# Patient Record
Sex: Female | Born: 1935 | Hispanic: Yes | Smoking: Never smoker
Health system: Southern US, Community
[De-identification: ages and names within clinical notes are randomized; demographics above are authoritative.]

## PROBLEM LIST (undated history)

## (undated) DIAGNOSIS — I252 Old myocardial infarction: Secondary | ICD-10-CM

## (undated) DIAGNOSIS — I10 Essential (primary) hypertension: Secondary | ICD-10-CM

## (undated) DIAGNOSIS — I2510 Atherosclerotic heart disease of native coronary artery without angina pectoris: Secondary | ICD-10-CM

## (undated) HISTORY — PX: APPENDECTOMY: SHX54

## (undated) HISTORY — PX: CORONARY ANGIOPLASTY WITH STENT PLACEMENT: SHX49

---

## 2012-12-30 DIAGNOSIS — I517 Cardiomegaly: Secondary | ICD-10-CM | POA: Insufficient documentation

## 2012-12-30 DIAGNOSIS — I5189 Other ill-defined heart diseases: Secondary | ICD-10-CM | POA: Insufficient documentation

## 2013-02-28 DIAGNOSIS — R002 Palpitations: Secondary | ICD-10-CM | POA: Insufficient documentation

## 2014-02-16 DIAGNOSIS — I2089 Other forms of angina pectoris: Secondary | ICD-10-CM | POA: Insufficient documentation

## 2014-02-16 DIAGNOSIS — I208 Other forms of angina pectoris: Secondary | ICD-10-CM | POA: Insufficient documentation

## 2014-05-27 DIAGNOSIS — M19041 Primary osteoarthritis, right hand: Secondary | ICD-10-CM | POA: Insufficient documentation

## 2014-05-27 DIAGNOSIS — M19042 Primary osteoarthritis, left hand: Secondary | ICD-10-CM

## 2014-10-23 DIAGNOSIS — Z85828 Personal history of other malignant neoplasm of skin: Secondary | ICD-10-CM | POA: Insufficient documentation

## 2016-09-04 DIAGNOSIS — J301 Allergic rhinitis due to pollen: Secondary | ICD-10-CM | POA: Insufficient documentation

## 2017-01-31 DIAGNOSIS — R413 Other amnesia: Secondary | ICD-10-CM | POA: Insufficient documentation

## 2017-10-10 ENCOUNTER — Ambulatory Visit
Admission: EM | Admit: 2017-10-10 | Discharge: 2017-10-10 | Disposition: A | Discharge status: Home / Self Care | Payer: Medicaid Other | Attending: Family Medicine | Admitting: Family Medicine

## 2017-10-10 DIAGNOSIS — L0889 Other specified local infections of the skin and subcutaneous tissue: Secondary | ICD-10-CM | POA: Diagnosis not present

## 2017-10-10 DIAGNOSIS — E7800 Pure hypercholesterolemia, unspecified: Secondary | ICD-10-CM | POA: Insufficient documentation

## 2017-10-10 DIAGNOSIS — I10 Essential (primary) hypertension: Secondary | ICD-10-CM | POA: Insufficient documentation

## 2017-10-10 DIAGNOSIS — I219 Acute myocardial infarction, unspecified: Secondary | ICD-10-CM | POA: Insufficient documentation

## 2017-10-10 DIAGNOSIS — B029 Zoster without complications: Secondary | ICD-10-CM | POA: Diagnosis not present

## 2017-10-10 DIAGNOSIS — C4490 Unspecified malignant neoplasm of skin, unspecified: Secondary | ICD-10-CM | POA: Insufficient documentation

## 2017-10-10 DIAGNOSIS — R0600 Dyspnea, unspecified: Secondary | ICD-10-CM | POA: Insufficient documentation

## 2017-10-10 DIAGNOSIS — I2510 Atherosclerotic heart disease of native coronary artery without angina pectoris: Secondary | ICD-10-CM | POA: Insufficient documentation

## 2017-10-10 MED ORDER — CEPHALEXIN 500 MG PO CAPS: 500.0000 mg | ORAL_CAPSULE | Freq: Three times a day (TID) | ORAL | 0 refills | Status: DC

## 2017-10-10 MED ORDER — VALACYCLOVIR HCL 1 G PO TABS: 1000.0000 mg | ORAL_TABLET | Freq: Three times a day (TID) | ORAL | 0 refills | Status: DC

## 2017-10-10 NOTE — ED Triage Notes (Signed)
Pt states she has a bump on her bottom that doesn't hurt but does itch. Did place alcohol and oil on it and doesn't itch as much but they do think it is infected.

## 2017-10-10 NOTE — ED Provider Notes (Signed)
[facility]    CSN: [id number] Arrival date & time: 10/10/17  1902     History   Chief Complaint Chief Complaint  Patient presents with  . Mass    HPI Laura Bass is a 84 y.o. female.   84 yo female with a c/o rash on the right buttock for the past 4-5 days. States rash is occasionally painful and itchy as well. Denies any fevers, chills.   The history is provided by the patient.    History reviewed. No pertinent past medical history.  Patient Active Problem List   Diagnosis Date Noted  . CAD (coronary artery disease) 10/10/2017  . Dyspnea 10/10/2017  . Essential hypertension 10/10/2017  . Hypercholesterolemia 10/10/2017  . Myocardial infarction ([place]) 10/10/2017  . Skin cancer 10/10/2017  . Memory loss or impairment 01/31/2017  . Seasonal allergic rhinitis due to pollen 09/04/2016  . Personal history of other malignant neoplasm of skin 10/23/2014  . Primary osteoarthritis of hands, bilateral 05/27/2014  . Angina of effort ([place]) 02/16/2014  . Palpitations 02/28/2013  . Diastolic dysfunction 70/35/0093  . LVH (left ventricular hypertrophy) 12/30/2012    History reviewed. No pertinent surgical history.  OB History   None      Home Medications    Prior to Admission medications   Medication Sig Start Date End Date Taking? Authorizing Provider  aspirin EC 81 MG tablet Take by mouth.   Yes [provider]  clopidogrel (PLAVIX) 75 MG tablet TAKE 1 TABLET BY MOUTH DAILY 07/16/17  Yes [provider]  ENSURE (ENSURE) Take by mouth. 05/01/16  Yes [provider]  omeprazole (PRILOSEC) 20 MG capsule  07/16/17  Yes [provider]  quinapril (ACCUPRIL) 10 MG tablet  09/22/17  Yes [provider]  simvastatin (ZOCOR) 20 MG tablet  08/22/17  Yes [provider]  spironolactone (ALDACTONE) 25 MG tablet TK 1/2 T PO ONCE D 07/24/17  Yes [provider]  cephALEXin (KEFLEX) 500 MG capsule Take  1 capsule (500 mg total) by mouth 3 (three) times daily. 10/10/17   Norval Gable, MD  valACYclovir (VALTREX) 1000 MG tablet Take 1 tablet (1,000 mg total) by mouth 3 (three) times daily. 10/10/17   Norval Gable, MD    Family History No family history on file.  Social History Social History   Tobacco Use  . Smoking status: Not on file  Substance Use Topics  . Alcohol use: Not on file  . Drug use: Not on file     Allergies   Atorvastatin and Oxybutynin chloride   Review of Systems Review of Systems   Physical Exam Triage Vital Signs ED Triage Vitals  Enc Vitals Group     BP 10/10/17 1920 (!) 148/81     Pulse Rate 10/10/17 1920 73     Resp 10/10/17 1920 18     Temp 10/10/17 1920 98.3 F (36.8 C)     Temp Source 10/10/17 1920 Oral     SpO2 10/10/17 1920 95 %     Weight --      Height --      Head Circumference --      Peak Flow --      Pain Score 10/10/17 1922 0     Pain Loc --      Pain Edu? --      Excl. in [place]? --    No data found.  Updated Vital Signs BP (!) 148/81 (BP Location: Left Arm)  Pulse 73   Temp 98.3 F (36.8 C) (Oral)   Resp 18   SpO2 95%   Visual Acuity Right Eye Distance:   Left Eye Distance:   Bilateral Distance:    Right Eye Near:   Left Eye Near:    Bilateral Near:     Physical Exam  Constitutional: She appears well-developed and well-nourished. No distress.  Skin: Rash (right upper buttock area; vesicular lesions with surrounding erythema) noted. She is not diaphoretic.  Nursing note and vitals reviewed.    UC Treatments / Results  Labs (all labs ordered are listed, but only abnormal results are displayed) Labs Reviewed - No data to display  EKG None  Radiology No results found.  Procedures Procedures (including critical care time)  Medications Ordered in UC Medications - No data to display  Initial Impression / Assessment and Plan / UC Course  I have reviewed the triage vital signs and the nursing  notes.  Pertinent labs & imaging results that were available during my care of the patient were reviewed by me and considered in my medical decision making (see chart for details).      Final Clinical Impressions(s) / UC Diagnoses   Final diagnoses:  Herpes zoster without complication  Secondary infection of skin    ED Prescriptions    Medication Sig Dispense Auth. Provider   valACYclovir (VALTREX) 1000 MG tablet Take 1 tablet (1,000 mg total) by mouth 3 (three) times daily. 21 tablet Knolan Simien, Linward Foster, MD   cephALEXin (KEFLEX) 500 MG capsule Take 1 capsule (500 mg total) by mouth 3 (three) times daily. 21 capsule Norval Gable, MD     1. diagnosis reviewed with patient and daughter 2. rx as per orders above; reviewed possible side effects, interactions, risks and benefits  3. Recommend supportive treatment with otc analgesic prn 4. Follow-up prn if symptoms worsen or don't improve   Controlled Substance Prescriptions [place] Controlled Substance Registry consulted? Not Applicable   Norval Gable, MD 10/10/17 2044

## 2018-04-09 ENCOUNTER — Other Ambulatory Visit: Payer: Self-pay

## 2018-04-09 ENCOUNTER — Ambulatory Visit
Admission: EM | Admit: 2018-04-09 | Discharge: 2018-04-09 | Disposition: A | Discharge status: Home / Self Care | Payer: Medicaid Other | Attending: Family Medicine | Admitting: Family Medicine

## 2018-04-09 DIAGNOSIS — R05 Cough: Secondary | ICD-10-CM | POA: Diagnosis not present

## 2018-04-09 DIAGNOSIS — R059 Cough, unspecified: Secondary | ICD-10-CM

## 2018-04-09 HISTORY — DX: Essential (primary) hypertension: I10

## 2018-04-09 HISTORY — DX: Atherosclerotic heart disease of native coronary artery without angina pectoris: I25.10

## 2018-04-09 HISTORY — DX: Old myocardial infarction: I25.2

## 2018-04-09 NOTE — ED Provider Notes (Signed)
[facility]    CSN: [id number] Arrival date & time: 04/09/18  1614     History   Chief Complaint Chief Complaint  Patient presents with  . Cough    HPI Laura Bass is a 84 y.o. female.   HPI  84 year old female accompanied by her daughter who provides translational services.  That the patient complained of a cough that started about 4 5 days ago.  Since daughter's son was diagnosed with pneumonia and they were wanting to have the patient checked out to make sure that this does not escalate to that point.  Does not have shortness of breath.  She has had no fever or chills.  She has been taking Robitussin cough syrup to help break up the phlegm which the patient states she has an excessive amount seems to be stuck.  Initially she had a O2 sat of 94% but later improved increased to 98%.  Viewed medical records from [facility] and an x-ray specifically from 08/06/2017 which showed hyperexpansion of her lungs suggestive of underlying obstructive lung disease.  She also had linear base bibasilar cities most likely atelectasis or scarring.  Is seen for a cough which had prompted the x-ray.  Time the patient is very spry alert conversant and does not appear ill at all.  Daughter states that this morning the patient did not want to get out of bed like she normally does early, instead like felt staying in bed which is unlike her.          Past Medical History:  Diagnosis Date  . CAD (coronary artery disease)   . History of MI (myocardial infarction)   . Hypertension     Patient Active Problem List   Diagnosis Date Noted  . CAD (coronary artery disease) 10/10/2017  . Dyspnea 10/10/2017  . Essential hypertension 10/10/2017  . Hypercholesterolemia 10/10/2017  . Myocardial infarction ([place]) 10/10/2017  . Skin cancer 10/10/2017  . Memory loss or impairment 01/31/2017  . Seasonal allergic rhinitis due to pollen 09/04/2016  . Personal history of  other malignant neoplasm of skin 10/23/2014  . Primary osteoarthritis of hands, bilateral 05/27/2014  . Angina of effort ([place]) 02/16/2014  . Palpitations 02/28/2013  . Diastolic dysfunction 40/98/1191  . LVH (left ventricular hypertrophy) 12/30/2012    Past Surgical History:  Procedure Laterality Date  . APPENDECTOMY    . CORONARY ANGIOPLASTY WITH STENT PLACEMENT      OB History   None      Home Medications    Prior to Admission medications   Medication Sig Start Date End Date Taking? Authorizing Provider  aspirin EC 81 MG tablet Take by mouth.   Yes [provider]  clopidogrel (PLAVIX) 75 MG tablet TAKE 1 TABLET BY MOUTH DAILY 07/16/17  Yes [provider]  ENSURE (ENSURE) Take by mouth. 05/01/16  Yes [provider]  omeprazole (PRILOSEC) 20 MG capsule  07/16/17  Yes [provider]  quinapril (ACCUPRIL) 10 MG tablet  09/22/17  Yes [provider]  simvastatin (ZOCOR) 20 MG tablet  08/22/17  Yes [provider]  spironolactone (ALDACTONE) 25 MG tablet TK 1/2 T PO ONCE D 07/24/17  Yes [provider]    Family History History reviewed. No pertinent family history.  Social History Social History   Tobacco Use  . Smoking status: Never Smoker  . Smokeless tobacco: Never Used  Substance Use Topics  . Alcohol use: Not Currently  . Drug use: Not  Currently     Allergies   Atorvastatin and Oxybutynin chloride   Review of Systems Review of Systems  Constitutional: Positive for activity change and fatigue. Negative for chills and fever.  Respiratory: Positive for cough. Negative for shortness of breath, wheezing and stridor.   All other systems reviewed and are negative.    Physical Exam Triage Vital Signs ED Triage Vitals  Enc Vitals Group     BP 04/09/18 1626 135/89     Pulse Rate 04/09/18 1626 86     Resp 04/09/18 1626 18     Temp 04/09/18 1626 98.7 F (37.1 C)     Temp Source 04/09/18 1626 Oral      SpO2 04/09/18 1626 94 %     Weight 04/09/18 1625 145 lb (65.8 kg)     Height 04/09/18 1625 5' (1.524 m)     Head Circumference --      Peak Flow --      Pain Score 04/09/18 1625 2     Pain Loc --      Pain Edu? --      Excl. in [place]? --    No data found.  Updated Vital Signs BP 135/89 (BP Location: Left Arm)   Pulse 86   Temp 98.7 F (37.1 C) (Oral)   Resp 18   Ht 5' (1.524 m)   Wt 145 lb (65.8 kg)   SpO2 94%   BMI 28.32 kg/m   Visual Acuity Right Eye Distance:   Left Eye Distance:   Bilateral Distance:    Right Eye Near:   Left Eye Near:    Bilateral Near:     Physical Exam  Constitutional: She is oriented to person, place, and time. She appears well-developed and well-nourished. No distress.  HENT:  Head: Normocephalic.  Right Ear: External ear normal.  Left Ear: External ear normal.  Nose: Nose normal.  Mouth/Throat: Oropharynx is clear and moist. No oropharyngeal exudate.  Eyes: Pupils are equal, round, and reactive to light. Right eye exhibits no discharge. Left eye exhibits no discharge.  Neck: Normal range of motion.  Pulmonary/Chest: Effort normal and breath sounds normal.  Musculoskeletal: Normal range of motion.  Neurological: She is alert and oriented to person, place, and time.  Skin: Skin is warm and dry. She is not diaphoretic.  Psychiatric: She has a normal mood and affect. Her behavior is normal. Judgment and thought content normal.  Nursing note and vitals reviewed.    UC Treatments / Results  Labs (all labs ordered are listed, but only abnormal results are displayed) Labs Reviewed - No data to display  EKG None  Radiology No results found.  Procedures Procedures (including critical care time)  Medications Ordered in UC Medications - No data to display  Initial Impression / Assessment and Plan / UC Course  I have reviewed the triage vital signs and the nursing notes.  Pertinent labs & imaging results that were available during my  care of the patient were reviewed by me and considered in my medical decision making (see chart for details).   Discussion with the daughter who translated to the patient.  I do not believe an x-ray is indicated.  She has a normal O2 sat no some lung sounds and appears very normal.  Watch out for any degradation of her condition specifically any fever chills productive cough extreme fatigue.  If  Any of these occur, they should take the patient to the emergency room or return to our  clinic   Final Clinical Impressions(s) / UC Diagnoses   Final diagnoses:  Cough   Discharge Instructions   None    ED Prescriptions    None     Controlled Substance Prescriptions [place] Controlled Substance Registry consulted? Not Applicable   Lorin Picket, PA-C 04/09/18 1704

## 2018-04-09 NOTE — ED Triage Notes (Signed)
Patient complains of cough that started on 4-5 days ago. Patient is being translated by daughter. Patient daughter states that a household member is sick with pneumonia and they want to make sure hers hasn't escalated to this.

## 2018-05-23 ENCOUNTER — Other Ambulatory Visit: Payer: Self-pay

## 2018-05-23 ENCOUNTER — Ambulatory Visit: Admit: 2018-05-23 | Discharge: 2018-05-23 | Disposition: A | Discharge status: Home / Self Care | Payer: Medicaid Other

## 2018-05-23 ENCOUNTER — Ambulatory Visit
Admission: EM | Admit: 2018-05-23 | Discharge: 2018-05-23 | Disposition: A | Discharge status: Home / Self Care | Payer: Medicaid Other | Attending: Family Medicine | Admitting: Family Medicine

## 2018-05-23 ENCOUNTER — Ambulatory Visit: Payer: Medicaid Other

## 2018-05-23 DIAGNOSIS — I10 Essential (primary) hypertension: Secondary | ICD-10-CM | POA: Diagnosis not present

## 2018-05-23 DIAGNOSIS — Z7982 Long term (current) use of aspirin: Secondary | ICD-10-CM | POA: Diagnosis not present

## 2018-05-23 DIAGNOSIS — Z79899 Other long term (current) drug therapy: Secondary | ICD-10-CM | POA: Insufficient documentation

## 2018-05-23 DIAGNOSIS — I219 Acute myocardial infarction, unspecified: Secondary | ICD-10-CM | POA: Diagnosis not present

## 2018-05-23 DIAGNOSIS — E7800 Pure hypercholesterolemia, unspecified: Secondary | ICD-10-CM | POA: Diagnosis not present

## 2018-05-23 DIAGNOSIS — I252 Old myocardial infarction: Secondary | ICD-10-CM | POA: Insufficient documentation

## 2018-05-23 DIAGNOSIS — R059 Cough, unspecified: Secondary | ICD-10-CM

## 2018-05-23 DIAGNOSIS — I700 Atherosclerosis of aorta: Secondary | ICD-10-CM | POA: Diagnosis not present

## 2018-05-23 DIAGNOSIS — I25119 Atherosclerotic heart disease of native coronary artery with unspecified angina pectoris: Secondary | ICD-10-CM | POA: Diagnosis not present

## 2018-05-23 DIAGNOSIS — Z955 Presence of coronary angioplasty implant and graft: Secondary | ICD-10-CM | POA: Diagnosis not present

## 2018-05-23 DIAGNOSIS — J479 Bronchiectasis, uncomplicated: Secondary | ICD-10-CM | POA: Insufficient documentation

## 2018-05-23 DIAGNOSIS — R05 Cough: Secondary | ICD-10-CM | POA: Diagnosis not present

## 2018-05-23 MED ORDER — BENZONATATE 100 MG PO CAPS: 100.0000 mg | ORAL_CAPSULE | Freq: Three times a day (TID) | ORAL | 0 refills | Status: DC | PRN

## 2018-05-23 NOTE — ED Triage Notes (Signed)
Per daughter, mom with cough x 7 days

## 2018-05-23 NOTE — Discharge Instructions (Signed)
Medication as needed.  Take care  Dr. Lacinda Axon

## 2018-05-24 NOTE — ED Provider Notes (Signed)
[facility]    CSN: [id number] Arrival date & time: 05/23/18  1837  History   Chief Complaint Chief Complaint  Patient presents with  . Cough   HPI  85 year old female presents with cough.  7-day history of cough.  Productive.  Associated fatigue.  No fever.  She has been using Robitussin without resolution.  No reports of shortness of breath.  No chest pain.  No other associated symptoms.  No known exacerbating factors.  No other complaints.   PMH, Surgical Hx, Family Hx, Social History reviewed and updated as below.  Past Medical History:  Diagnosis Date  . CAD (coronary artery disease)   . History of MI (myocardial infarction)   . Hypertension     Patient Active Problem List   Diagnosis Date Noted  . CAD (coronary artery disease) 10/10/2017  . Dyspnea 10/10/2017  . Essential hypertension 10/10/2017  . Hypercholesterolemia 10/10/2017  . Myocardial infarction ([place]) 10/10/2017  . Skin cancer 10/10/2017  . Memory loss or impairment 01/31/2017  . Seasonal allergic rhinitis due to pollen 09/04/2016  . Personal history of other malignant neoplasm of skin 10/23/2014  . Primary osteoarthritis of hands, bilateral 05/27/2014  . Angina of effort ([place]) 02/16/2014  . Palpitations 02/28/2013  . Diastolic dysfunction 57/26/2035  . LVH (left ventricular hypertrophy) 12/30/2012    Past Surgical History:  Procedure Laterality Date  . APPENDECTOMY    . CORONARY ANGIOPLASTY WITH STENT PLACEMENT      OB History   No obstetric history on file.      Home Medications    Prior to Admission medications   Medication Sig Start Date End Date Taking? Authorizing Provider  aspirin EC 81 MG tablet Take by mouth.   Yes [provider]  clopidogrel (PLAVIX) 75 MG tablet TAKE 1 TABLET BY MOUTH DAILY 07/16/17  Yes [provider]  ENSURE (ENSURE) Take by mouth. 05/01/16  Yes [provider]  omeprazole (PRILOSEC) 20 MG capsule  07/16/17  Yes [provider]  quinapril (ACCUPRIL) 10 MG tablet  09/22/17  Yes [provider]  simvastatin (ZOCOR) 20 MG tablet  08/22/17  Yes [provider]  spironolactone (ALDACTONE) 25 MG tablet TK 1/2 T PO ONCE D 07/24/17  Yes [provider]  benzonatate (TESSALON) 100 MG capsule Take 1 capsule (100 mg total) by mouth 3 (three) times daily as needed. 05/23/18   Coral Spikes, DO   Social History Social History   Tobacco Use  . Smoking status: Never Smoker  . Smokeless tobacco: Never Used  Substance Use Topics  . Alcohol use: Not Currently  . Drug use: Not Currently    Allergies   Atorvastatin and Oxybutynin chloride   Review of Systems Review of Systems  Constitutional: Negative for fever.  Respiratory: Positive for cough.   Cardiovascular: Negative for chest pain.   Physical Exam Triage Vital Signs ED Triage Vitals  Enc Vitals Group     BP 05/23/18 2003 125/73     Pulse Rate 05/23/18 2003 74     Resp --      Temp 05/23/18 2003 98.6 F (37 C)     Temp Source 05/23/18 2003 Oral     SpO2 05/23/18 2003 96 %     Weight 05/23/18 2004 145 lb (65.8 kg)     Height --      Head Circumference --      Peak Flow --      Pain Score 05/23/18 2004 0  Pain Loc --      Pain Edu? --      Excl. in [place]? --    Updated Vital Signs BP 125/73 (BP Location: Left Arm)   Pulse 74   Temp 98.6 F (37 C) (Oral)   Wt 65.8 kg   SpO2 96%   BMI 28.32 kg/m   Visual Acuity Right Eye Distance:   Left Eye Distance:   Bilateral Distance:    Right Eye Near:   Left Eye Near:    Bilateral Near:     Physical Exam Vitals signs and nursing note reviewed.  Constitutional:      General: She is not in acute distress. HENT:     Head: Normocephalic and atraumatic.     Mouth/Throat:     Pharynx: Oropharynx is clear. No posterior oropharyngeal erythema.  Eyes:     General: No scleral icterus.    Conjunctiva/sclera: Conjunctivae normal.  Cardiovascular:     Rate and Rhythm:  Normal rate and regular rhythm.  Pulmonary:     Effort: Pulmonary effort is normal.     Breath sounds: No wheezing, rhonchi or rales.  Neurological:     Mental Status: She is alert.  Psychiatric:        Mood and Affect: Mood normal.        Behavior: Behavior normal.    UC Treatments / Results  Labs (all labs ordered are listed, but only abnormal results are displayed) Labs Reviewed - No data to display  EKG None  Radiology Dg Chest 2 View  Result Date: 05/23/2018 CLINICAL DATA:  Cough for 7 days EXAM: CHEST - 2 VIEW COMPARISON:  None. FINDINGS: Linear scarring or atelectasis at the bases. No acute consolidation or pleural effusion. Cardiomediastinal silhouette within normal limits with aortic atherosclerosis. Possible trace amount of pneumomediastinum. No pneumothorax. Mild scoliosis and degenerative change of the spine. IMPRESSION: 1. No focal pulmonary infiltrate. Linear scarring or atelectasis at both bases 2. Possible trace pneumomediastinum Electronically Signed   By: Donavan Foil M.D.   On: 05/23/2018 20:38   Ct Chest Wo Contrast  Result Date: 05/23/2018 CLINICAL DATA:  85 year old with persistent cough. EXAM: CT CHEST WITHOUT CONTRAST TECHNIQUE: Multidetector CT imaging of the chest was performed following the standard protocol without IV contrast. COMPARISON:  Radiographs earlier this day. FINDINGS: Cardiovascular: Advanced aortic atherosclerosis with descending tortuosity. No aneurysmal dilatation. No periaortic stranding. Heart is normal in size. There are coronary artery calcifications. No pericardial effusion. Mediastinum/Nodes: Small mediastinal nodes all subcentimeter, likely reactive. No evidence of hilar adenopathy. No pneumomediastinum. Esophagus is decompressed. Heterogeneous thyroid gland with small nodules, all less than 1.5 cm and the not meet size criteria for further evaluation. Lungs/Pleura: Scattered linear atelectasis or scarring in both lungs, most prominent  involving the lingula and left lower lobe. Mild bilateral lower lobe and lingular no pulmonary mass. Bronchiectasis. No pneumothorax. No confluent airspace disease. No pulmonary edema or pleural fluid. Upper Abdomen: Probable cyst in the posterior left kidney, incompletely included in the field of view. No acute or suspicious findings. Musculoskeletal: There are no acute or suspicious osseous abnormalities. IMPRESSION: 1. Scattered linear atelectasis or scarring in both lungs, most prominent in the lingula and left lower lobe. Mild bilateral lower lobe and lingular bronchiectasis. 2. Coronary artery calcifications. Aortic Atherosclerosis (ICD10-I70.0). 3. No pneumomediastinum. The question pneumomediastinum on radiograph was artifact related to paramediastinal vessel or scarring. Electronically Signed   By: Keith Rake M.D.   On: 05/23/2018 21:41    Procedures  Procedures (including critical care time)  Medications Ordered in UC Medications - No data to display  Initial Impression / Assessment and Plan / UC Course  I have reviewed the triage vital signs and the nursing notes.  Pertinent labs & imaging results that were available during my care of the patient were reviewed by me and considered in my medical decision making (see chart for details).    85 year old female presents with cough.  Given age and comorbidity, chest x-ray was obtained.  Chest x-ray revealed concern for trace amount of pneumoperitoneum.  Subsequent CT was obtained and was essentially unremarkable and did not reveal pneumoperitoneum.  Tessalon Perles for cough.  Supportive care.  Final Clinical Impressions(s) / UC Diagnoses   Final diagnoses:  Cough     Discharge Instructions     Medication as needed.  Take care  Dr. Lacinda Axon    ED Prescriptions    Medication Sig Dispense Auth. Provider   benzonatate (TESSALON) 100 MG capsule Take 1 capsule (100 mg total) by mouth 3 (three) times daily as needed. 30 capsule  Coral Spikes, DO     Controlled Substance Prescriptions [place] Controlled Substance Registry consulted? Not Applicable   Coral Spikes, DO 05/24/18 1357

## 2020-10-27 ENCOUNTER — Encounter: Payer: Self-pay | Admitting: Emergency Medicine

## 2020-10-27 ENCOUNTER — Other Ambulatory Visit: Payer: Self-pay

## 2020-10-27 ENCOUNTER — Emergency Department: Payer: Medicaid Other

## 2020-10-27 DIAGNOSIS — I10 Essential (primary) hypertension: Secondary | ICD-10-CM | POA: Diagnosis not present

## 2020-10-27 DIAGNOSIS — I2510 Atherosclerotic heart disease of native coronary artery without angina pectoris: Secondary | ICD-10-CM | POA: Insufficient documentation

## 2020-10-27 DIAGNOSIS — R0981 Nasal congestion: Secondary | ICD-10-CM | POA: Diagnosis not present

## 2020-10-27 DIAGNOSIS — Z85828 Personal history of other malignant neoplasm of skin: Secondary | ICD-10-CM | POA: Insufficient documentation

## 2020-10-27 DIAGNOSIS — Z79899 Other long term (current) drug therapy: Secondary | ICD-10-CM | POA: Insufficient documentation

## 2020-10-27 DIAGNOSIS — Z7982 Long term (current) use of aspirin: Secondary | ICD-10-CM | POA: Diagnosis not present

## 2020-10-27 DIAGNOSIS — Z955 Presence of coronary angioplasty implant and graft: Secondary | ICD-10-CM | POA: Diagnosis not present

## 2020-10-27 LAB — CBC
HCT: 35.6 % — ABNORMAL LOW (ref 36.0–46.0)
Hemoglobin: 12 g/dL (ref 12.0–15.0)
MCH: 31.5 pg (ref 26.0–34.0)
MCHC: 33.7 g/dL (ref 30.0–36.0)
MCV: 93.4 fL (ref 80.0–100.0)
Platelets: 180 10*3/uL (ref 150–400)
RBC: 3.81 MIL/uL — ABNORMAL LOW (ref 3.87–5.11)
RDW: 13.2 % (ref 11.5–15.5)
WBC: 5.5 10*3/uL (ref 4.0–10.5)
nRBC: 0 % (ref 0.0–0.2)

## 2020-10-27 LAB — BASIC METABOLIC PANEL
Anion gap: 9 (ref 5–15)
BUN: 24 mg/dL — ABNORMAL HIGH (ref 8–23)
CO2: 23 mmol/L (ref 22–32)
Calcium: 9.7 mg/dL (ref 8.9–10.3)
Chloride: 103 mmol/L (ref 98–111)
Creatinine, Ser: 0.88 mg/dL (ref 0.44–1.00)
GFR, Estimated: >60 mL/min (ref >60)
Glucose, Bld: 102 mg/dL — ABNORMAL HIGH (ref 70–99)
Potassium: 3.8 mmol/L (ref 3.5–5.1)
Sodium: 135 mmol/L (ref 135–145)

## 2020-10-27 LAB — TROPONIN I (HIGH SENSITIVITY): Troponin I (High Sensitivity): 5 ng/L (ref <18)

## 2020-10-27 NOTE — ED Triage Notes (Addendum)
Pt arrived via [facility] from home, per EMS, pt was praying in bed and started having some breathing difficulties. Per EMS when they arrived pt was sitting in the chair in no distress.   Pt speaks Spanish. Pt speaking in complete sentences without running out of breath. Pt is hard of hearing.  Per EMS pt is admant about not receiving COVID vaccine.   Pt states that she passed out but did not lose consciousness when she was in bed praying. No cough present.

## 2020-10-28 ENCOUNTER — Emergency Department: Payer: Medicaid Other

## 2020-10-28 ENCOUNTER — Emergency Department
Admission: EM | Admit: 2020-10-28 | Discharge: 2020-10-28 | Disposition: A | Discharge status: Home / Self Care | Payer: Medicaid Other | Attending: Emergency Medicine | Admitting: Emergency Medicine

## 2020-10-28 DIAGNOSIS — R0981 Nasal congestion: Secondary | ICD-10-CM

## 2020-10-28 LAB — TROPONIN I (HIGH SENSITIVITY): Troponin I (High Sensitivity): 5 ng/L (ref <18)

## 2020-10-28 NOTE — Discharge Instructions (Addendum)
You may use nasal saline provided to spray each nostril to reduce congestion.  Return to the ER for worsening symptoms, persistent vomiting, difficulty breathing or other concerns.

## 2020-10-28 NOTE — ED Provider Notes (Signed)
[facility] Emergency Department Provider Note   ____________________________________________   Event Date/Time   First MD Initiated Contact with Patient 10/28/20 0249     (approximate)  I have reviewed the triage vital signs and the nursing notes.   HISTORY  Chief Complaint Shortness of Breath  Level V caveat: History obtained via daughter  HPI Laura Bass is a 87 y.o. female brought to the ED via EMS from home with a chief complaint of nasal congestion.  Patient states she was praying in bed and began to have trouble breathing through her nose due to congestion.  EMS noted patient was sitting on the chair in no acute distress.  There was a report that patient had a syncopal episode but she adamantly denies syncope to me.  Denies fever, cough, chest pain, shortness of breath, abdominal pain, nausea, vomiting or dizziness.  At the time of our interview patient feels fine and is eager for discharge home.     Past Medical History:  Diagnosis Date   CAD (coronary artery disease)    History of MI (myocardial infarction)    Hypertension     Patient Active Problem List   Diagnosis Date Noted   CAD (coronary artery disease) 10/10/2017   Dyspnea 10/10/2017   Essential hypertension 10/10/2017   Hypercholesterolemia 10/10/2017   Myocardial infarction ([place]) 10/10/2017   Skin cancer 10/10/2017   Memory loss or impairment 01/31/2017   Seasonal allergic rhinitis due to pollen 09/04/2016   Personal history of other malignant neoplasm of skin 10/23/2014   Primary osteoarthritis of hands, bilateral 05/27/2014   Angina of effort ([place]) 02/16/2014   Palpitations 16/02/9603   Diastolic dysfunction 54/01/8118   LVH (left ventricular hypertrophy) 12/30/2012    Past Surgical History:  Procedure Laterality Date   APPENDECTOMY     CORONARY ANGIOPLASTY WITH STENT PLACEMENT      Prior to Admission medications   Medication Sig Start Date End Date  Taking? Authorizing Provider  aspirin EC 81 MG tablet Take by mouth.    [provider]  benzonatate (TESSALON) 100 MG capsule Take 1 capsule (100 mg total) by mouth 3 (three) times daily as needed. 05/23/18   Coral Spikes, DO  clopidogrel (PLAVIX) 75 MG tablet TAKE 1 TABLET BY MOUTH DAILY 07/16/17   [provider]  ENSURE (ENSURE) Take by mouth. 05/01/16   [provider]  omeprazole (PRILOSEC) 20 MG capsule  07/16/17   [provider]  quinapril (ACCUPRIL) 10 MG tablet  09/22/17   [provider]  simvastatin (ZOCOR) 20 MG tablet  08/22/17   [provider]  spironolactone (ALDACTONE) 25 MG tablet TK 1/2 T PO ONCE D 07/24/17   [provider]    Allergies Atorvastatin and Oxybutynin chloride  History reviewed. No pertinent family history.  Social History Social History   Tobacco Use   Smoking status: Never   Smokeless tobacco: Never  Vaping Use   Vaping Use: Never used  Substance Use Topics   Alcohol use: Not Currently   Drug use: Not Currently    Review of Systems  Constitutional: No fever/chills Eyes: No visual changes. ENT: Positive for nasal congestion.  No sore throat. Cardiovascular: Denies chest pain. Respiratory: Denies shortness of breath. Gastrointestinal: No abdominal pain.  No nausea, no vomiting.  No diarrhea.  No constipation. Genitourinary: Negative for dysuria. Musculoskeletal: Negative for back pain. Skin: Negative for rash. Neurological: Negative for headaches, focal weakness or numbness.   ____________________________________________  PHYSICAL EXAM:  VITAL SIGNS: ED Triage Vitals  Enc Vitals Group     BP 10/27/20 2308 (!) 169/83     Pulse Rate 10/27/20 2308 66     Resp 10/27/20 2308 20     Temp 10/27/20 2308 97.7 F (36.5 C)     Temp Source 10/27/20 2308 Oral     SpO2 10/27/20 2308 95 %     Weight 10/27/20 2315 140 lb (63.5 kg)     Height 10/27/20 2315 5\' 5"  (1.651 m)     Head  Circumference --      Peak Flow --      Pain Score 10/27/20 2315 0     Pain Loc --      Pain Edu? --      Excl. in [place]? --     Constitutional: Alert and oriented.  Appears younger than stated age and in no acute distress. Eyes: Conjunctivae are normal. PERRL. EOMI. Head: Atraumatic. Nose: No congestion/rhinnorhea. Mouth/Throat: Mucous membranes are moist.   Neck: No stridor.   Cardiovascular: Normal rate, regular rhythm. Grossly normal heart sounds.  Good peripheral circulation. Respiratory: Normal respiratory effort.  No retractions. Lungs CTAB. Gastrointestinal: Soft and nontender. No distention. No abdominal bruits. No CVA tenderness. Musculoskeletal: No lower extremity tenderness nor edema.  No joint effusions. Neurologic:  Normal speech and language. No gross focal neurologic deficits are appreciated.  Skin:  Skin is warm, dry and intact. No rash noted. Psychiatric: Mood and affect are normal. Speech and behavior are normal.  ____________________________________________   LABS (all labs ordered are listed, but only abnormal results are displayed)  Labs Reviewed  BASIC METABOLIC PANEL - Abnormal; Notable for the following components:      Result Value   Glucose, Bld 102 (*)    BUN 24 (*)    All other components within normal limits  CBC - Abnormal; Notable for the following components:   RBC 3.81 (*)    HCT 35.6 (*)    All other components within normal limits  TROPONIN I (HIGH SENSITIVITY)  TROPONIN I (HIGH SENSITIVITY)   ____________________________________________  EKG  ED ECG REPORT I, Mikisha Roseland J, the attending physician, personally viewed and interpreted this ECG.   Date: 10/28/2020  EKG Time: 2313  Rate: 63  Rhythm: normal EKG, normal sinus rhythm  Axis: Normal  Intervals:none  ST&T Change: Nonspecific  ____________________________________________  RADIOLOGY I, Marston Mccadden J, personally viewed and evaluated these images (plain radiographs) as part of  my medical decision making, as well as reviewing the written report by the radiologist.  ED MD interpretation: No acute cardiopulmonary process  Official radiology report(s): DG Chest 2 View  Result Date: 10/28/2020 CLINICAL DATA:  Dyspnea EXAM: CHEST - 2 VIEW COMPARISON:  05/23/2018 FINDINGS: The lungs are symmetrically well expanded. Mild bibasilar atelectasis or scarring. No pneumothorax or pleural effusion. Cardiac size within normal limits. The thoracic aorta is mildly tortuous, unchanged. Pulmonary vascularity is normal. No acute bone abnormality. Osseous structures are age-appropriate. IMPRESSION: No active cardiopulmonary disease. Electronically Signed   By: Fidela Salisbury MD   On: 10/28/2020 00:45    ____________________________________________   PROCEDURES  Procedure(s) performed (including Critical Care):  Procedures   ____________________________________________   INITIAL IMPRESSION / ASSESSMENT AND PLAN / ED COURSE  As part of my medical decision making, I reviewed the following data within the [place] History obtained from family, Nursing notes reviewed and incorporated, Labs reviewed, EKG interpreted, Old chart reviewed, Radiograph reviewed, and Notes  from prior ED visits     87 year old female presenting with nasal congestion. Differential includes, but is not limited to, viral syndrome, bronchitis including COPD exacerbation, pneumonia, reactive airway disease including asthma, CHF including exacerbation with or without pulmonary/interstitial edema, pneumothorax, ACS, thoracic trauma, and pulmonary embolism.   Laboratory results including 2 sets of troponins and chest x-ray negative.  Patient feeling fine and eager for discharge home.  Declined offer for COVID swab.  Daughter at bedside and states patient has not had any breathing difficulty while she has been in the ED.  Saline bullets provided to use as needed for congestion.  Strict return  precautions given.  Both verbalized understanding agree with plan of care.      ____________________________________________   FINAL CLINICAL IMPRESSION(S) / ED DIAGNOSES  Final diagnoses:  Nasal congestion     ED Discharge Orders     None        Note:  This document was prepared using Dragon voice recognition software and may include unintentional dictation errors.    Paulette Blanch, MD 10/28/20 [phone]

## 2020-11-16 ENCOUNTER — Other Ambulatory Visit: Payer: Self-pay

## 2020-11-16 ENCOUNTER — Encounter: Payer: Self-pay | Admitting: Emergency Medicine

## 2020-11-16 ENCOUNTER — Emergency Department: Payer: Medicaid Other

## 2020-11-16 DIAGNOSIS — Z85828 Personal history of other malignant neoplasm of skin: Secondary | ICD-10-CM | POA: Diagnosis not present

## 2020-11-16 DIAGNOSIS — Z955 Presence of coronary angioplasty implant and graft: Secondary | ICD-10-CM | POA: Insufficient documentation

## 2020-11-16 DIAGNOSIS — I2510 Atherosclerotic heart disease of native coronary artery without angina pectoris: Secondary | ICD-10-CM | POA: Diagnosis not present

## 2020-11-16 DIAGNOSIS — Z79899 Other long term (current) drug therapy: Secondary | ICD-10-CM | POA: Insufficient documentation

## 2020-11-16 DIAGNOSIS — R0602 Shortness of breath: Secondary | ICD-10-CM | POA: Diagnosis not present

## 2020-11-16 DIAGNOSIS — I10 Essential (primary) hypertension: Secondary | ICD-10-CM | POA: Diagnosis not present

## 2020-11-16 LAB — CBC
HCT: 36.1 % (ref 36.0–46.0)
Hemoglobin: 12.3 g/dL (ref 12.0–15.0)
MCH: 31.8 pg (ref 26.0–34.0)
MCHC: 34.1 g/dL (ref 30.0–36.0)
MCV: 93.3 fL (ref 80.0–100.0)
Platelets: 175 10*3/uL (ref 150–400)
RBC: 3.87 MIL/uL (ref 3.87–5.11)
RDW: 13.2 % (ref 11.5–15.5)
WBC: 5.4 10*3/uL (ref 4.0–10.5)
nRBC: 0 % (ref 0.0–0.2)

## 2020-11-16 LAB — BASIC METABOLIC PANEL
Anion gap: 6 (ref 5–15)
BUN: 22 mg/dL (ref 8–23)
CO2: 26 mmol/L (ref 22–32)
Calcium: 9.7 mg/dL (ref 8.9–10.3)
Chloride: 103 mmol/L (ref 98–111)
Creatinine, Ser: 0.8 mg/dL (ref 0.44–1.00)
GFR, Estimated: >60 mL/min (ref >60)
Glucose, Bld: 103 mg/dL — ABNORMAL HIGH (ref 70–99)
Potassium: 3.7 mmol/L (ref 3.5–5.1)
Sodium: 135 mmol/L (ref 135–145)

## 2020-11-16 LAB — TROPONIN I (HIGH SENSITIVITY): Troponin I (High Sensitivity): 5 ng/L (ref <18)

## 2020-11-16 NOTE — ED Triage Notes (Addendum)
Pt arrived via [facility] pt states she was praying and started getting short of breath and called grandson and told him that she couldn't breath.   Pt denies any chest pain.  Pt denies any shortness of breath at this time.  Pt seen here about 3 weeks ago for the same.   Pt requires spanish interpreter. Denies any recent cough.  Pt able to speak in complete sentences without running out of breath.

## 2020-11-17 ENCOUNTER — Emergency Department
Admission: EM | Admit: 2020-11-17 | Discharge: 2020-11-17 | Disposition: A | Discharge status: Home / Self Care | Payer: Medicaid Other | Attending: Emergency Medicine | Admitting: Emergency Medicine

## 2020-11-17 DIAGNOSIS — R0602 Shortness of breath: Secondary | ICD-10-CM

## 2020-11-17 LAB — TROPONIN I (HIGH SENSITIVITY): Troponin I (High Sensitivity): 5 ng/L (ref <18)

## 2020-11-17 NOTE — ED Provider Notes (Signed)
[facility] Emergency Department Provider Note  ____________________________________________   Event Date/Time   First MD Initiated Contact with Patient 11/17/20 0030     (approximate)  I have reviewed the triage vital signs and the nursing notes.   HISTORY  Chief Complaint Shortness of Breath  The patient and/or family speak(s) Spanish.  They understand they have the right to the use of a hospital interpreter, however at this time they prefer to speak directly with me in [place].  They know that they can ask for an interpreter at any time.   HPI Laura Bass is a 87 y.o. female with medical history as listed below who presents by EMS for evaluation of shortness of breath.  This occurred acutely while she was praying at home.  Her daughter speaks [place] and is at bedside.  Her daughter told me that this is happened before and I verified in.  She had a similar visit a few weeks ago.  The daughter said that she thinks the problem is that the patient gets cold easily so she closes upper doors and windows and air vents that he gets very hot and stuffy in her room..  Then she starts to feel short of breath while she is praying.  The paramedics also commented on how hot is up in the room and felt when they got there.  As soon as the patient got out of the room and to the emergency department she felt fine and has had no repeat shortness of breath.  She has been in the ED for nearly 4 hours and has had no recurrence of symptoms.  She denies any pain including chest pain.  She denies fever, nausea, vomiting, abdominal pain, and dysuria.  Nothing in particular makes the symptoms worse and getting out into the open air made her feel better.     Past Medical History:  Diagnosis Date   CAD (coronary artery disease)    History of MI (myocardial infarction)    Hypertension     Patient Active Problem List   Diagnosis Date Noted   CAD (coronary artery  disease) 10/10/2017   Dyspnea 10/10/2017   Essential hypertension 10/10/2017   Hypercholesterolemia 10/10/2017   Myocardial infarction ([place]) 10/10/2017   Skin cancer 10/10/2017   Memory loss or impairment 01/31/2017   Seasonal allergic rhinitis due to pollen 09/04/2016   Personal history of other malignant neoplasm of skin 10/23/2014   Primary osteoarthritis of hands, bilateral 05/27/2014   Angina of effort ([place]) 02/16/2014   Palpitations 71/10/2692   Diastolic dysfunction 85/46/2703   LVH (left ventricular hypertrophy) 12/30/2012    Past Surgical History:  Procedure Laterality Date   APPENDECTOMY     CORONARY ANGIOPLASTY WITH STENT PLACEMENT      Prior to Admission medications   Medication Sig Start Date End Date Taking? Authorizing Provider  aspirin EC 81 MG tablet Take by mouth.    [provider]  benzonatate (TESSALON) 100 MG capsule Take 1 capsule (100 mg total) by mouth 3 (three) times daily as needed. 05/23/18   Coral Spikes, DO  clopidogrel (PLAVIX) 75 MG tablet TAKE 1 TABLET BY MOUTH DAILY 07/16/17   [provider]  ENSURE (ENSURE) Take by mouth. 05/01/16   [provider]  omeprazole (PRILOSEC) 20 MG capsule  07/16/17   [provider]  quinapril (ACCUPRIL) 10 MG tablet  09/22/17   [provider]  simvastatin (ZOCOR) 20 MG tablet  08/22/17  [provider]  spironolactone (ALDACTONE) 25 MG tablet TK 1/2 T PO ONCE D 07/24/17   [provider]    Allergies Atorvastatin and Oxybutynin chloride  History reviewed. No pertinent family history.  Social History Social History   Tobacco Use   Smoking status: Never   Smokeless tobacco: Never  Vaping Use   Vaping Use: Never used  Substance Use Topics   Alcohol use: Not Currently   Drug use: Not Currently    Review of Systems Constitutional: No fever/chills Eyes: No visual changes. ENT: No sore throat. Cardiovascular: Denies chest pain. Respiratory:  Positive for shortness of breath. Gastrointestinal: No abdominal pain.  No nausea, no vomiting.  No diarrhea.  No constipation. Genitourinary: Negative for dysuria. Musculoskeletal: Negative for neck pain.  Negative for back pain. Integumentary: Negative for rash. Neurological: Negative for headaches, focal weakness or numbness.   ____________________________________________   PHYSICAL EXAM:  VITAL SIGNS: ED Triage Vitals  Enc Vitals Group     BP 11/16/20 2142 (!) 155/79     Pulse Rate 11/16/20 2142 67     Resp 11/16/20 2142 18     Temp 11/16/20 2142 98.1 F (36.7 C)     Temp Source 11/16/20 2142 Oral     SpO2 11/16/20 2141 98 %     Weight 11/16/20 2148 63.5 kg (140 lb)     Height 11/16/20 2148 1.651 m (5\' 5" )     Head Circumference --      Peak Flow --      Pain Score 11/16/20 2147 0     Pain Loc --      Pain Edu? --      Excl. in [place]? --     Constitutional: Alert and oriented.  Appears younger than chronological age. Eyes: Conjunctivae are normal.  Head: Atraumatic. Nose: No congestion/rhinnorhea. Mouth/Throat: Patient is wearing a mask. Neck: No stridor.  No meningeal signs.   Cardiovascular: Normal rate, regular rhythm. Good peripheral circulation. Respiratory: Normal respiratory effort.  No retractions.  Lungs are clear to auscultation bilaterally with no wheezes, rales, nor rhonchi.  No evidence of volume overload. Gastrointestinal: Soft and nontender. No distention.  Musculoskeletal: No lower extremity tenderness nor edema. No gross deformities of extremities. Neurologic:  Normal speech and language. No gross focal neurologic deficits are appreciated.  Skin:  Skin is warm, dry and intact. Psychiatric: Mood and affect are normal. Speech and behavior are normal.  ____________________________________________   LABS (all labs ordered are listed, but only abnormal results are displayed)  Labs Reviewed  BASIC METABOLIC PANEL - Abnormal; Notable for the following  components:      Result Value   Glucose, Bld 103 (*)    All other components within normal limits  CBC  TROPONIN I (HIGH SENSITIVITY)  TROPONIN I (HIGH SENSITIVITY)   ____________________________________________  EKG  ED ECG REPORT I, Hinda Kehr, the attending physician, personally viewed and interpreted this ECG.  Date: 11/16/2020 EKG Time: 21: 46 Rate: 73 Rhythm: normal sinus rhythm QRS Axis: normal Intervals: normal ST/T Wave abnormalities: normal Narrative Interpretation: no evidence of acute ischemia  ____________________________________________  RADIOLOGY I, Hinda Kehr, personally viewed and evaluated these images (plain radiographs) as part of my medical decision making, as well as reviewing the written report by the radiologist.  ED MD interpretation: No acute abnormalities  Official radiology report(s): DG Chest 2 View  Result Date: 11/16/2020 CLINICAL DATA:  Shortness of breath EXAM: CHEST - 2 VIEW COMPARISON:  10/28/2020 FINDINGS: Heart is  normal size. Tortuous, calcified aorta. Linear scarring in the lung bases. No acute confluent opacities or effusions. No acute bony abnormality. IMPRESSION: Bibasilar scarring.  No active disease. Electronically Signed   By: Rolm Baptise M.D.   On: 11/16/2020 22:25    ____________________________________________   PROCEDURES   Procedure(s) performed (including Critical Care):  Procedures   ____________________________________________   INITIAL IMPRESSION / MDM / [place] / ED COURSE  As part of my medical decision making, I reviewed the following data within the [place] notes reviewed and incorporated, Labs reviewed , EKG interpreted , Old chart reviewed, Radiograph reviewed , and Notes from prior ED visits   Differential diagnosis includes, but is not limited to, anxiety, environmental issues (lack of air circulation), pneumonia, interstitial lung disease, volume overload,  ACS, PE, nonspecific infection.  Patient is well-appearing and in no distress, appears younger than chronological age, and has vital signs that are within normal limits.  Her initial respiratory rate was a little bit high upon arrival to triage but that has completely normalized.  She said that she feels fine and is eager to go, as is her daughter who is at bedside.  Her daughter is very comfortable with the plan and asked me to please stressed to the patient that it is important she open her air vents while she is in her room "so we do not have to keep coming here".  I talked with the patient about this and she says that she understands and will wear a sweater in the future so she does not get cold.  I personally reviewed the patient's imaging and agree with the radiologist's interpretation that there are no acute abnormalities on chest x-ray.  Nonischemic EKG.  Basic metabolic panel, troponin, and CBC are all normal.  I gave my usual and customary return precautions and the patient and her daughter understand and agree with the plan.           ____________________________________________  FINAL CLINICAL IMPRESSION(S) / ED DIAGNOSES  Final diagnoses:  SOB (shortness of breath)     MEDICATIONS GIVEN DURING THIS VISIT:  Medications - No data to display   ED Discharge Orders     None        Note:  This document was prepared using Dragon voice recognition software and may include unintentional dictation errors.   Hinda Kehr, MD 11/17/20 [phone]

## 2020-11-21 ENCOUNTER — Other Ambulatory Visit: Payer: Self-pay

## 2020-11-21 ENCOUNTER — Emergency Department: Payer: Medicaid Other

## 2020-11-21 ENCOUNTER — Encounter: Payer: Self-pay | Admitting: Emergency Medicine

## 2020-11-21 ENCOUNTER — Emergency Department
Admission: EM | Admit: 2020-11-21 | Discharge: 2020-11-21 | Disposition: A | Discharge status: Home / Self Care | Payer: Medicaid Other | Attending: Emergency Medicine | Admitting: Emergency Medicine

## 2020-11-21 DIAGNOSIS — Z7902 Long term (current) use of antithrombotics/antiplatelets: Secondary | ICD-10-CM | POA: Diagnosis not present

## 2020-11-21 DIAGNOSIS — Z85828 Personal history of other malignant neoplasm of skin: Secondary | ICD-10-CM | POA: Diagnosis not present

## 2020-11-21 DIAGNOSIS — Z79899 Other long term (current) drug therapy: Secondary | ICD-10-CM | POA: Insufficient documentation

## 2020-11-21 DIAGNOSIS — Z7982 Long term (current) use of aspirin: Secondary | ICD-10-CM | POA: Insufficient documentation

## 2020-11-21 DIAGNOSIS — R0989 Other specified symptoms and signs involving the circulatory and respiratory systems: Secondary | ICD-10-CM | POA: Diagnosis not present

## 2020-11-21 DIAGNOSIS — I2510 Atherosclerotic heart disease of native coronary artery without angina pectoris: Secondary | ICD-10-CM | POA: Diagnosis not present

## 2020-11-21 DIAGNOSIS — R0602 Shortness of breath: Secondary | ICD-10-CM | POA: Diagnosis present

## 2020-11-21 DIAGNOSIS — I10 Essential (primary) hypertension: Secondary | ICD-10-CM | POA: Insufficient documentation

## 2020-11-21 LAB — BASIC METABOLIC PANEL
Anion gap: 9 (ref 5–15)
BUN: 22 mg/dL (ref 8–23)
CO2: 25 mmol/L (ref 22–32)
Calcium: 10 mg/dL (ref 8.9–10.3)
Chloride: 103 mmol/L (ref 98–111)
Creatinine, Ser: 0.74 mg/dL (ref 0.44–1.00)
GFR, Estimated: >60 mL/min (ref >60)
Glucose, Bld: 100 mg/dL — ABNORMAL HIGH (ref 70–99)
Potassium: 4 mmol/L (ref 3.5–5.1)
Sodium: 137 mmol/L (ref 135–145)

## 2020-11-21 LAB — CBC
HCT: 37.2 % (ref 36.0–46.0)
Hemoglobin: 12.7 g/dL (ref 12.0–15.0)
MCH: 32.2 pg (ref 26.0–34.0)
MCHC: 34.1 g/dL (ref 30.0–36.0)
MCV: 94.2 fL (ref 80.0–100.0)
Platelets: 178 10*3/uL (ref 150–400)
RBC: 3.95 MIL/uL (ref 3.87–5.11)
RDW: 13.1 % (ref 11.5–15.5)
WBC: 4.4 10*3/uL (ref 4.0–10.5)
nRBC: 0 % (ref 0.0–0.2)

## 2020-11-21 LAB — TROPONIN I (HIGH SENSITIVITY)
Troponin I (High Sensitivity): 8 ng/L (ref <18)
Troponin I (High Sensitivity): 9 ng/L (ref <18)

## 2020-11-21 MED ORDER — GUAIFENESIN ER 600 MG PO TB12: 600.0000 mg | ORAL_TABLET | Freq: Two times a day (BID) | ORAL | 0 refills | Status: AC

## 2020-11-21 MED ORDER — ISOSORBIDE MONONITRATE ER 30 MG PO TB24: 30.0000 mg | ORAL_TABLET | Freq: Every day | ORAL | 0 refills | Status: DC

## 2020-11-21 NOTE — ED Notes (Signed)
Dr. Fath at bedside  

## 2020-11-21 NOTE — ED Provider Notes (Signed)
[facility] Emergency Department Provider Note  ____________________________________________  Time seen: Approximately 9:15 AM  I have reviewed the triage vital signs and the nursing notes.   HISTORY  Chief Complaint Shortness of Breath  Family member at bedside served as [place] interpreter at patient request  HPI Atalaya Zappia de Lourido is a 87 y.o. female with a history of CAD status post PCI in 2005, hypertension, angina who is brought to the ED by her daughter today due to an episode of shortness of breath this morning while laying down.  The shortness of breath has resolved.  No associated chest pain.  She does feel like fingers of both hands get tingly during the shortness of breath episodes.  No vomiting.  No radiating pain.  No diaphoresis.  No palpitations or dizziness or syncope.  Patient has also been having congestion and thicker sputum recently.  No fever.  No exertional symptoms.  Patient has had several shortness of breath episodes over the last 3 weeks.  She has seen primary care who discontinued spironolactone due to suspected intolerance.  Blood pressure has remained okay afterward.  She is awaiting follow-up with her cardiologist at [facility], but has been told that the appointment will be much later in the summer.    Past Medical History:  Diagnosis Date   CAD (coronary artery disease)    History of MI (myocardial infarction)    Hypertension      Patient Active Problem List   Diagnosis Date Noted   CAD (coronary artery disease) 10/10/2017   Dyspnea 10/10/2017   Essential hypertension 10/10/2017   Hypercholesterolemia 10/10/2017   Myocardial infarction ([place]) 10/10/2017   Skin cancer 10/10/2017   Memory loss or impairment 01/31/2017   Seasonal allergic rhinitis due to pollen 09/04/2016   Personal history of other malignant neoplasm of skin 10/23/2014   Primary osteoarthritis of hands, bilateral 05/27/2014   Angina of effort ([place])  02/16/2014   Palpitations 64/40/3474   Diastolic dysfunction 25/95/6387   LVH (left ventricular hypertrophy) 12/30/2012     Past Surgical History:  Procedure Laterality Date   APPENDECTOMY     CORONARY ANGIOPLASTY WITH STENT PLACEMENT       Prior to Admission medications   Medication Sig Start Date End Date Taking? Authorizing Provider  guaiFENesin (MUCINEX) 600 MG 12 hr tablet Take 1 tablet (600 mg total) by mouth 2 (two) times daily for 15 days. 11/21/20 12/06/20 Yes Carrie Mew, MD  isosorbide mononitrate (IMDUR) 30 MG 24 hr tablet Take 1 tablet (30 mg total) by mouth daily for 15 days. 11/21/20 12/06/20 Yes Carrie Mew, MD  aspirin EC 81 MG tablet Take by mouth.    [provider]  benzonatate (TESSALON) 100 MG capsule Take 1 capsule (100 mg total) by mouth 3 (three) times daily as needed. 05/23/18   Coral Spikes, DO  clopidogrel (PLAVIX) 75 MG tablet TAKE 1 TABLET BY MOUTH DAILY 07/16/17   [provider]  ENSURE (ENSURE) Take by mouth. 05/01/16   [provider]  omeprazole (PRILOSEC) 20 MG capsule  07/16/17   [provider]  quinapril (ACCUPRIL) 10 MG tablet  09/22/17   [provider]  simvastatin (ZOCOR) 20 MG tablet  08/22/17   [provider]  spironolactone (ALDACTONE) 25 MG tablet TK 1/2 T PO ONCE D 07/24/17   [provider]     Allergies Atorvastatin, Oxybutynin chloride, and Spironolactone   History reviewed. No pertinent family history.  Social History Social History  Tobacco Use   Smoking status: Never   Smokeless tobacco: Never  Vaping Use   Vaping Use: Never used  Substance Use Topics   Alcohol use: Not Currently   Drug use: Not Currently    Review of Systems  Constitutional:   No fever or chills.  ENT:   No sore throat. No rhinorrhea. Cardiovascular:   No chest pain or syncope. Respiratory:   Positive shortness of breath and cough. Gastrointestinal:   Negative for abdominal pain,  vomiting and diarrhea.  Musculoskeletal:   Negative for focal pain or swelling.  Small bruise over the right heel which is getting better according to daughter All other systems reviewed and are negative except as documented above in ROS and HPI.  ____________________________________________   PHYSICAL EXAM:  VITAL SIGNS: ED Triage Vitals  Enc Vitals Group     BP 11/21/20 0615 130/71     Pulse Rate 11/21/20 0615 64     Resp 11/21/20 0615 18     Temp 11/21/20 0615 98.4 F (36.9 C)     Temp Source 11/21/20 0615 Oral     SpO2 11/21/20 0615 97 %     Weight --      Height --      Head Circumference --      Peak Flow --      Pain Score 11/21/20 0619 0     Pain Loc --      Pain Edu? --      Excl. in [place]? --     Vital signs reviewed, nursing assessments reviewed.   Constitutional:   Alert and oriented. Non-toxic appearance. Eyes:   Conjunctivae are normal. EOMI. PERRL. ENT      Head:   Normocephalic and atraumatic.      Nose:   Wearing a mask.      Mouth/Throat:   Wearing a mask.      Neck:   No meningismus. Full ROM. Hematological/Lymphatic/Immunilogical:   No cervical lymphadenopathy. Cardiovascular:   RRR. Symmetric bilateral radial and DP pulses.  No murmurs. Cap refill less than 2 seconds. Respiratory:   Normal respiratory effort without tachypnea/retractions. Breath sounds are clear and equal bilaterally. No wheezes/rales/rhonchi. Gastrointestinal:   Soft and nontender. Non distended. There is no CVA tenderness.  No rebound, rigidity, or guarding. Genitourinary:   deferred Musculoskeletal:   Normal range of motion in all extremities. No joint effusions.  No lower extremity tenderness.  No edema.  Small area of ecchymosis over the right distal Achilles tendon.  Tendon is intact and nontender along its course. Neurologic:   Normal speech and language.  Motor grossly intact. No acute focal neurologic deficits are appreciated.  Skin:    Skin is warm, dry and intact. No rash  noted.  No petechiae, purpura, or bullae.  ____________________________________________    LABS (pertinent positives/negatives) (all labs ordered are listed, but only abnormal results are displayed) Labs Reviewed  BASIC METABOLIC PANEL - Abnormal; Notable for the following components:      Result Value   Glucose, Bld 100 (*)    All other components within normal limits  CBC  TROPONIN I (HIGH SENSITIVITY)  TROPONIN I (HIGH SENSITIVITY)   ____________________________________________   EKG  Interpreted by me Normal sinus rhythm rate of 66, normal axis and intervals.  Normal QRS ST segments and T waves.  No acute ischemic changes.  No evidence of underlying arrhythmia.  ____________________________________________    RADIOLOGY  DG Chest 2 View  Result Date: 11/21/2020 CLINICAL  DATA:  87 year old female with shortness of breath onset last night. EXAM: CHEST - 2 VIEW COMPARISON:  Chest radiograph 11/16/2020 and earlier. FINDINGS: Chronically large lung volumes, tortuous thoracic aorta and other mediastinal contours. Lower lung volumes today compared to 2020. No pneumothorax, pulmonary edema, pleural effusion or confluent pulmonary opacity. Stable linear basilar opacities compatible with atelectasis or scarring. No acute osseous abnormality identified. Calcified aortic atherosclerosis. Negative visible bowel gas pattern. IMPRESSION: Chronic lung disease with hyperinflation. Lower lung volumes. No acute cardiopulmonary abnormality. Electronically Signed   By: Genevie Ann M.D.   On: 11/21/2020 07:30    ____________________________________________   PROCEDURES Procedures  ____________________________________________  DIFFERENTIAL DIAGNOSIS   Bronchitis, pleural effusion, pulmonary edema, non-STEMI, anxiety  CLINICAL IMPRESSION / ASSESSMENT AND PLAN / ED COURSE  Medications ordered in the ED: Medications - No data to display  Pertinent labs & imaging results that were available  during my care of the patient were reviewed by me and considered in my medical decision making (see chart for details).  Jhordyn Hoopingarner de Lourido was evaluated in Emergency Department on 11/21/2020 for the symptoms described in the history of present illness. She was evaluated in the context of the global COVID-19 pandemic, which necessitated consideration that the patient might be at risk for infection with the SARS-CoV-2 virus that causes COVID-19. Institutional protocols and algorithms that pertain to the evaluation of patients at risk for COVID-19 are in a state of rapid change based on information released by regulatory bodies including the CDC and federal and state organizations. These policies and algorithms were followed during the patient's care in the ED.   Patient presents with multiple vague episodes of shortness of breath which are brief, resolve on their own, not associated with chest pain, not exertional.  Patient is nontoxic and well-appearing with unremarkable vital signs and reassuring exam.  EKG nonischemic.  Will trend troponins to rule out MI given age and past history.  Chest x-ray viewed and interpreted by me, shows hyperinflation and evidence of chronic lung disease but otherwise unremarkable without edema effusion or consolidation.  I suspect her symptoms are pulmonary with thickened sputum and needing to cough to clear her throat.  I will plan to prescribe Mucinex.  Clinical Course as of 11/21/20 1027  Sun Nov 21, 2020  0958 Patient seen by Dr. Ubaldo Glassing with cardiology who recommends starting Imdur.  Patient suitable for outpatient follow-up with her cardiologist at this point. [PS]    Clinical Course User Index [PS] Carrie Mew, MD     ____________________________________________   FINAL CLINICAL IMPRESSION(S) / ED DIAGNOSES    Final diagnoses:  Shortness of breath     ED Discharge Orders          Ordered    isosorbide mononitrate (IMDUR) 30 MG 24 hr  tablet  Daily        11/21/20 1026    guaiFENesin (MUCINEX) 600 MG 12 hr tablet  2 times daily        11/21/20 1026            Portions of this note were generated with dragon dictation software. Dictation errors may occur despite best attempts at proofreading.   Carrie Mew, MD 11/21/20 1027

## 2020-11-21 NOTE — Consult Note (Signed)
Cardiology Consultation Note    Patient ID: Laura Bass, MRN: [id number], DOB/AGE: [id number] y.o. Admit date: 11/21/2020   Date of Consult: 11/21/2020 Primary Physician: Myrtie Soman, MD Primary Cardiologist: Dr. Thea Gist  Chief Complaint: chest pain Reason for Consultation: chest pain Requesting MD: Dr. Joni Fears  HPI: Laura Bass is a 87 y.o. female with history of coronary artery disease dating back to 2005 where she underwent a PCI in [place].  She underwent a PCI to the RCA in October 2015 as well as a PCI of LAD in 2016.  She has a history of HFpEF with a known EF of 55% as well as hypertension and palpitations.  She has not been followed closely recently by her cardiologist.  Last visit was in 2019 per chart.  She has frequent presentations to emergency room with complaints of shortness of breath cough and being anxious when it is hard for her to expectorate.  She had a telephone visit with her primary care provider November 18, 2020 due to shortness of breath.  She is currently being treated with Plavix and aspirin, omeprazole, simvastatin and spironolactone along with lisinopril.  She returns again to emergency room with complaints of cough and shortness of breath and chest tightness.  Laboratories show a normal troponin similar to findings several days ago on a similar presentation to the ER.  Renal function normal and CBC is normal.  Electrocardiogram shows normal sinus rhythm with no ischemia no change from baseline.  Via interpreter, patient does not really have chest pain but more upper throat dyspnea and tightness.  Past Medical History:  Diagnosis Date   CAD (coronary artery disease)    History of MI (myocardial infarction)    Hypertension       Surgical History:  Past Surgical History:  Procedure Laterality Date   APPENDECTOMY     CORONARY ANGIOPLASTY WITH STENT PLACEMENT       Home Meds: Prior to Admission medications   Medication Sig  Start Date End Date Taking? Authorizing Provider  aspirin EC 81 MG tablet Take by mouth.    [provider]  benzonatate (TESSALON) 100 MG capsule Take 1 capsule (100 mg total) by mouth 3 (three) times daily as needed. 05/23/18   Coral Spikes, DO  clopidogrel (PLAVIX) 75 MG tablet TAKE 1 TABLET BY MOUTH DAILY 07/16/17   [provider]  ENSURE (ENSURE) Take by mouth. 05/01/16   [provider]  omeprazole (PRILOSEC) 20 MG capsule  07/16/17   [provider]  quinapril (ACCUPRIL) 10 MG tablet  09/22/17   [provider]  simvastatin (ZOCOR) 20 MG tablet  08/22/17   [provider]  spironolactone (ALDACTONE) 25 MG tablet TK 1/2 T PO ONCE D 07/24/17   [provider]    Inpatient Medications:     Allergies:  Allergies  Allergen Reactions   Atorvastatin Nausea And Vomiting    Other reaction(s): Muscle Pain   Oxybutynin Chloride Palpitations    tachycardia   Spironolactone     Other reaction(s): Muscle Pain    Social History   Socioeconomic History   Marital status: Widowed    Spouse name: Not on file   Number of children: Not on file   Years of education: Not on file   Highest education level: Not on file  Occupational History   Not on file  Tobacco Use   Smoking status: Never   Smokeless tobacco: Never  Vaping Use  Vaping Use: Never used  Substance and Sexual Activity   Alcohol use: Not Currently   Drug use: Not Currently   Sexual activity: Not on file  Other Topics Concern   Not on file  Social History Narrative   Not on file   Social Determinants of Health   Financial Resource Strain: Not on file  Food Insecurity: Not on file  Transportation Needs: Not on file  Physical Activity: Not on file  Stress: Not on file  Social Connections: Not on file  Intimate Partner Violence: Not on file     History reviewed. No pertinent family history.   Review of Systems: A 12-system review of systems was performed  and is negative except as noted in the HPI.  Labs: No results for input(s): CKTOTAL, CKMB, TROPONINI in the last 72 hours. Lab Results  Component Value Date   WBC 4.4 11/21/2020   HGB 12.7 11/21/2020   HCT 37.2 11/21/2020   MCV 94.2 11/21/2020   PLT 178 11/21/2020    Recent Labs  Lab 11/21/20 0644  NA 137  K 4.0  CL 103  CO2 25  BUN 22  CREATININE 0.74  CALCIUM 10.0  GLUCOSE 100*   No results found for: CHOL, HDL, LDLCALC, TRIG No results found for: DDIMER  Radiology/Studies:  DG Chest 2 View  Result Date: 11/21/2020 CLINICAL DATA:  87 year old female with shortness of breath onset last night. EXAM: CHEST - 2 VIEW COMPARISON:  Chest radiograph 11/16/2020 and earlier. FINDINGS: Chronically large lung volumes, tortuous thoracic aorta and other mediastinal contours. Lower lung volumes today compared to 2020. No pneumothorax, pulmonary edema, pleural effusion or confluent pulmonary opacity. Stable linear basilar opacities compatible with atelectasis or scarring. No acute osseous abnormality identified. Calcified aortic atherosclerosis. Negative visible bowel gas pattern. IMPRESSION: Chronic lung disease with hyperinflation. Lower lung volumes. No acute cardiopulmonary abnormality. Electronically Signed   By: Genevie Ann M.D.   On: 11/21/2020 07:30   DG Chest 2 View  Result Date: 11/16/2020 CLINICAL DATA:  Shortness of breath EXAM: CHEST - 2 VIEW COMPARISON:  10/28/2020 FINDINGS: Heart is normal size. Tortuous, calcified aorta. Linear scarring in the lung bases. No acute confluent opacities or effusions. No acute bony abnormality. IMPRESSION: Bibasilar scarring.  No active disease. Electronically Signed   By: Rolm Baptise M.D.   On: 11/16/2020 22:25   DG Chest 2 View  Result Date: 10/28/2020 CLINICAL DATA:  Dyspnea EXAM: CHEST - 2 VIEW COMPARISON:  05/23/2018 FINDINGS: The lungs are symmetrically well expanded. Mild bibasilar atelectasis or scarring. No pneumothorax or pleural effusion.  Cardiac size within normal limits. The thoracic aorta is mildly tortuous, unchanged. Pulmonary vascularity is normal. No acute bone abnormality. Osseous structures are age-appropriate. IMPRESSION: No active cardiopulmonary disease. Electronically Signed   By: Fidela Salisbury MD   On: 10/28/2020 00:45    Wt Readings from Last 3 Encounters:  11/16/20 63.5 kg  10/27/20 63.5 kg  05/23/18 65.8 kg    EKG: Normal sinus rhythm with no ischemia  Physical Exam: Pleasant [place] female in no acute distress Blood pressure 130/71, pulse 64, temperature 98.4 F (36.9 C), temperature source Oral, resp. rate 18, SpO2 97 %. There is no height or weight on file to calculate BMI. General: Well developed, well nourished, in no acute distress. Head: Normocephalic, atraumatic, sclera non-icteric, no xanthomas, nares are without discharge.  Neck: Negative for carotid bruits. JVD not elevated. Lungs: Clear bilaterally to auscultation without wheezes, rales, or rhonchi. Breathing is unlabored. Heart:  RRR with S1 S2. No murmurs, rubs, or gallops appreciated. Abdomen: Soft, non-tender, non-distended with normoactive bowel sounds. No hepatomegaly. No rebound/guarding. No obvious abdominal masses. Msk:  Strength and tone appear normal for age. Extremities: No clubbing or cyanosis. No edema.  Distal pedal pulses are 2+ and equal bilaterally. Neuro: Alert and oriented X 3. No facial asymmetry. No focal deficit. Moves all extremities spontaneously. Psych:  Responds to questions appropriately with a normal affect.     Assessment and Plan  87 y.o. female with history of coronary artery disease dating back to 2005 where she underwent a PCI in [place].  She underwent a PCI to the RCA in October 2015 as well as a PCI of LAD in 2016.  She has a history of HFpEF with a known EF of 55% as well as hypertension and palpitations.  She has not been followed closely recently by her cardiologist.  Last visit was in 2019 per chart.   She has frequent presentations to emergency room with complaints of shortness of breath cough and being anxious when it is hard for her to expectorate.  She had a telephone visit with her primary care provider November 18, 2020 due to shortness of breath.  She is currently being treated with Plavix and aspirin, omeprazole, simvastatin and spironolactone along with lisinopril.  She returns again to emergency room with complaints of cough and shortness of breath and chest tightness.  Laboratories show a normal troponin similar to findings several days ago on a similar presentation to the ER.  Renal function normal and CBC is normal.  Electrocardiogram shows normal sinus rhythm with no ischemia no change from baseline.  1.  Chest pain-atypical.  Has ruled out for myocardial infarction.  EKG unremarkable.  Will add low-dose Imdur to see if this will help with some of her symptoms following hemodynamics.  Will discuss with the patient's primary cardiology team Brennan/Stephens at [facility] heart on Tuesday and inform them of the plan.  I feels okay for the patient to be discharged on low-dose long-acting nitrates and the remainder of her medications.  2.  Dyspnea-symptoms appear to be difficulty expectorating.  Would agree with Mucinex.  Again okay for discharge from a cardiac standpoint.  Signed, Teodoro Spray MD 11/21/2020, 9:38 AM Pager: [phone]

## 2020-11-21 NOTE — ED Triage Notes (Signed)
FIRST NURSE NOTE:  PT arrived via [facility] from home with reports of tingling in the fingers.  Pt seen here recently for shortness of breath.  Pt speaks spanish.

## 2020-11-21 NOTE — ED Triage Notes (Addendum)
Pt arrived via [facility] from home, pt states she was laying down and started feeling short of breath, pt states she was walking and the shortness of breath started while she was walking.  Pt was not asleep when the shortness of breath started. Pt denies any shortness of breath at this time.   [place] Ahr [id number] used for triage. Pt has difficulty hearing VRI, daughter also with patient during triage.  Daughter states she thinks the patient has a blockage and is asking if cardiology is on call.  Daughter states she is not able to make an appointment with cardiology yet. Daughter states she has a video monitor in the patient's room and saw her laying down and having trouble breathing.

## 2021-05-07 ENCOUNTER — Ambulatory Visit
Admission: EM | Admit: 2021-05-07 | Discharge: 2021-05-07 | Disposition: A | Discharge status: Home / Self Care | Payer: Medicaid Other | Attending: Emergency Medicine | Admitting: Emergency Medicine

## 2021-05-07 ENCOUNTER — Other Ambulatory Visit: Payer: Self-pay

## 2021-05-07 DIAGNOSIS — R350 Frequency of micturition: Secondary | ICD-10-CM | POA: Diagnosis present

## 2021-05-07 LAB — URINALYSIS, COMPLETE (UACMP) WITH MICROSCOPIC
Bilirubin Urine: NEGATIVE
Glucose, UA: NEGATIVE mg/dL
Ketones, ur: NEGATIVE mg/dL
Leukocytes,Ua: NEGATIVE
Nitrite: NEGATIVE
Specific Gravity, Urine: 1.02 (ref 1.005–1.030)
pH: 5.5 (ref 5.0–8.0)

## 2021-05-07 NOTE — ED Provider Notes (Signed)
HPI  SUBJECTIVE:  Laura Bass is a 87 y.o. female who presents with 3 to 4 days of urinary frequency, "strong" odorous urine.  Some bilateral low back pain, but patient is attributing this to sleeping in her bed.  No fevers, vomiting, abdominal pain, dysuria, urgency, cloudy urine, hematuria, altered mental status, vaginal itching, discharge or odor.  No antibiotics in the past month.  No antipyretic in the past 6 hours.  Daughter has been giving the patient Tylenol and pushing fluids with improvement in her symptoms.  Her back pain is worse with lying in bed.  She has a past medical history of MI, memory issues, UTI, hypertension.  No history of pyelonephritis, nephrolithiasis, aortic abdominal aneurysm, diabetes, chronic kidney disease.  PMD: [facility] [place].  most history obtained through daughter  Past Medical History:  Diagnosis Date   CAD (coronary artery disease)    History of MI (myocardial infarction)    Hypertension     Past Surgical History:  Procedure Laterality Date   APPENDECTOMY     CORONARY ANGIOPLASTY WITH STENT PLACEMENT      History reviewed. No pertinent family history.  Social History   Tobacco Use   Smoking status: Never   Smokeless tobacco: Never  Vaping Use   Vaping Use: Never used  Substance Use Topics   Alcohol use: Not Currently   Drug use: Not Currently    No current facility-administered medications for this encounter.  Current Outpatient Medications:    aspirin EC 81 MG tablet, Take by mouth., Disp: , Rfl:    clopidogrel (PLAVIX) 75 MG tablet, TAKE 1 TABLET BY MOUTH DAILY, Disp: , Rfl:    ENSURE (ENSURE), Take by mouth., Disp: , Rfl:    omeprazole (PRILOSEC) 20 MG capsule, , Disp: , Rfl: 3   quinapril (ACCUPRIL) 10 MG tablet, , Disp: , Rfl: 3   simvastatin (ZOCOR) 20 MG tablet, , Disp: , Rfl: 3   spironolactone (ALDACTONE) 25 MG tablet, TK 1/2 T PO ONCE D, Disp: , Rfl: 0   isosorbide mononitrate (IMDUR) 30 MG 24 hr  tablet, Take 1 tablet (30 mg total) by mouth daily for 15 days., Disp: 15 tablet, Rfl: 0  Allergies  Allergen Reactions   Atorvastatin Nausea And Vomiting    Other reaction(s): Muscle Pain   Oxybutynin Chloride Palpitations    tachycardia   Spironolactone     Other reaction(s): Muscle Pain     ROS  As noted in HPI.   Physical Exam  BP (!) 141/73 (BP Location: Left Arm)    Pulse 90    Temp 98 F (36.7 C) (Oral)    Wt 63.5 kg    SpO2 96%    BMI 23.30 kg/m   Constitutional: Well developed, well nourished, no acute distress Eyes:  EOMI, conjunctiva normal bilaterally HENT: Normocephalic, atraumatic,mucus membranes moist Respiratory: Normal inspiratory effort Cardiovascular: Normal rate GI: nondistended, soft, nontender.  No suprapubic, midline, flank tenderness. Back: No CVAT.  No paralumbar tenderness. skin: No rash, skin intact Musculoskeletal: no deformities Neurologic: Alert & oriented x 3, no focal neuro deficits Psychiatric: Speech and behavior appropriate   ED Course   Medications - No data to display  Orders Placed This Encounter  Procedures   Urine Culture    Standing Status:   Standing    Number of Occurrences:   1    Order Specific Question:   Indication    Answer:   Urgency/frequency   Urinalysis, Complete w Microscopic  Standing Status:   Standing    Number of Occurrences:   1    Results for orders placed or performed during the hospital encounter of 05/07/21 (from the past 24 hour(s))  Urinalysis, Complete w Microscopic Urine, Clean Catch     Status: Abnormal   Collection Time: 05/07/21  3:44 PM  Result Value Ref Range   Color, Urine YELLOW YELLOW   APPearance CLEAR CLEAR   Specific Gravity, Urine 1.020 1.005 - 1.030   pH 5.5 5.0 - 8.0   Glucose, UA NEGATIVE NEGATIVE mg/dL   Hgb urine dipstick TRACE (A) NEGATIVE   Bilirubin Urine NEGATIVE NEGATIVE   Ketones, ur NEGATIVE NEGATIVE mg/dL   Protein, ur TRACE (A) NEGATIVE mg/dL   Nitrite  NEGATIVE NEGATIVE   Leukocytes,Ua NEGATIVE NEGATIVE   Squamous Epithelial / LPF 0-5 0 - 5   WBC, UA 0-5 0 - 5 WBC/hpf   RBC / HPF 0-5 0 - 5 RBC/hpf   Bacteria, UA RARE (A) NONE SEEN   No results found.  ED Clinical Impression  1. Urinary frequency      ED Assessment/Plan  Patient with trace hematuria, proteinuria and rare bacteria.  Negative nitrites, esterase.  Patient has no vaginal complaints.  Thus wet prep was not done. unsure as to the cause of her symptoms, but will send her urine off for culture to make sure that she does not have a urinary tract infection.  Will contact daughter Ina Kick at [phone] if patient needs antibiotics.  Continue Tylenol.  Follow-up with PMD in several days if not getting any better, ER return precautions given.  Discussed labs,  MDM, treatment plan, and plan for follow-up with family. Discussed sn/sx that should prompt return to the ED. family agrees with plan.   No orders of the defined types were placed in this encounter.     *This clinic note was created using Dragon dictation software. Therefore, there may be occasional mistakes despite careful proofreading.  ?    Melynda Ripple, MD 05/08/21 [phone]

## 2021-05-07 NOTE — Discharge Instructions (Addendum)
Someone will contact you if your urine culture comes back positive for urinary tract infection, and we will start her on the appropriate antibiotics at that time.  Continue Tylenol 3-4 times a day as needed for pain.

## 2021-05-07 NOTE — ED Triage Notes (Signed)
Patient is here today with Daughter due to "flank pain" & "strong urine smell". Dysuria "?".No fever.

## 2021-05-09 LAB — URINE CULTURE: Culture: NO GROWTH

## 2022-03-31 ENCOUNTER — Emergency Department: Payer: Medicaid Other

## 2022-03-31 ENCOUNTER — Other Ambulatory Visit: Payer: Self-pay

## 2022-03-31 ENCOUNTER — Emergency Department
Admission: EM | Admit: 2022-03-31 | Discharge: 2022-03-31 | Disposition: A | Discharge status: Home / Self Care | Payer: Medicaid Other | Attending: Emergency Medicine | Admitting: Emergency Medicine

## 2022-03-31 DIAGNOSIS — Z85828 Personal history of other malignant neoplasm of skin: Secondary | ICD-10-CM | POA: Insufficient documentation

## 2022-03-31 DIAGNOSIS — Z79899 Other long term (current) drug therapy: Secondary | ICD-10-CM | POA: Insufficient documentation

## 2022-03-31 DIAGNOSIS — I2510 Atherosclerotic heart disease of native coronary artery without angina pectoris: Secondary | ICD-10-CM | POA: Diagnosis not present

## 2022-03-31 DIAGNOSIS — J40 Bronchitis, not specified as acute or chronic: Secondary | ICD-10-CM | POA: Insufficient documentation

## 2022-03-31 DIAGNOSIS — R0602 Shortness of breath: Secondary | ICD-10-CM | POA: Diagnosis present

## 2022-03-31 DIAGNOSIS — Z955 Presence of coronary angioplasty implant and graft: Secondary | ICD-10-CM | POA: Diagnosis not present

## 2022-03-31 DIAGNOSIS — I10 Essential (primary) hypertension: Secondary | ICD-10-CM | POA: Insufficient documentation

## 2022-03-31 DIAGNOSIS — Z7982 Long term (current) use of aspirin: Secondary | ICD-10-CM | POA: Insufficient documentation

## 2022-03-31 DIAGNOSIS — Z20822 Contact with and (suspected) exposure to covid-19: Secondary | ICD-10-CM | POA: Diagnosis not present

## 2022-03-31 LAB — CBC WITH DIFFERENTIAL/PLATELET
Abs Immature Granulocytes: 0.01 10*3/uL (ref 0.00–0.07)
Basophils Absolute: 0.1 10*3/uL (ref 0.0–0.1)
Basophils Relative: 1 %
Eosinophils Absolute: 0.1 10*3/uL (ref 0.0–0.5)
Eosinophils Relative: 3 %
HCT: 35.6 % — ABNORMAL LOW (ref 36.0–46.0)
Hemoglobin: 12 g/dL (ref 12.0–15.0)
Immature Granulocytes: 0 %
Lymphocytes Relative: 32 %
Lymphs Abs: 1.6 10*3/uL (ref 0.7–4.0)
MCH: 30.6 pg (ref 26.0–34.0)
MCHC: 33.7 g/dL (ref 30.0–36.0)
MCV: 90.8 fL (ref 80.0–100.0)
Monocytes Absolute: 0.5 10*3/uL (ref 0.1–1.0)
Monocytes Relative: 11 %
Neutro Abs: 2.8 10*3/uL (ref 1.7–7.7)
Neutrophils Relative %: 53 %
Platelets: 207 10*3/uL (ref 150–400)
RBC: 3.92 MIL/uL (ref 3.87–5.11)
RDW: 13.3 % (ref 11.5–15.5)
WBC: 5.1 10*3/uL (ref 4.0–10.5)
nRBC: 0 % (ref 0.0–0.2)

## 2022-03-31 LAB — RESP PANEL BY RT-PCR (FLU A&B, COVID) ARPGX2
Influenza A by PCR: NEGATIVE
Influenza B by PCR: NEGATIVE
SARS Coronavirus 2 by RT PCR: NEGATIVE

## 2022-03-31 LAB — COMPREHENSIVE METABOLIC PANEL
ALT: 16 U/L (ref 0–44)
AST: 19 U/L (ref 15–41)
Albumin: 3.9 g/dL (ref 3.5–5.0)
Alkaline Phosphatase: 42 U/L (ref 38–126)
Anion gap: 6 (ref 5–15)
BUN: 24 mg/dL — ABNORMAL HIGH (ref 8–23)
CO2: 26 mmol/L (ref 22–32)
Calcium: 9.9 mg/dL (ref 8.9–10.3)
Chloride: 104 mmol/L (ref 98–111)
Creatinine, Ser: 0.8 mg/dL (ref 0.44–1.00)
GFR, Estimated: >60 mL/min (ref >60)
Glucose, Bld: 101 mg/dL — ABNORMAL HIGH (ref 70–99)
Potassium: 4.1 mmol/L (ref 3.5–5.1)
Sodium: 136 mmol/L (ref 135–145)
Total Bilirubin: 0.5 mg/dL (ref 0.3–1.2)
Total Protein: 6.8 g/dL (ref 6.5–8.1)

## 2022-03-31 LAB — TROPONIN I (HIGH SENSITIVITY): Troponin I (High Sensitivity): 6 ng/L (ref <18)

## 2022-03-31 LAB — BRAIN NATRIURETIC PEPTIDE: B Natriuretic Peptide: 26.4 pg/mL (ref 0.0–100.0)

## 2022-03-31 MED ORDER — AZITHROMYCIN 250 MG PO TABS: 250.0000 mg | ORAL_TABLET | Freq: Every day | ORAL | 0 refills | Status: DC

## 2022-03-31 MED ORDER — AZITHROMYCIN 500 MG PO TABS
500.0000 mg | ORAL_TABLET | Freq: Once | ORAL | Status: AC
  Administered 2022-03-31: 500 mg via ORAL
  Filled 2022-03-31: qty 1

## 2022-03-31 NOTE — ED Provider Notes (Signed)
[facility] Provider Note    Event Date/Time   First MD Initiated Contact with Patient 03/31/22 0236     (approximate)   History   Shortness of Breath   HPI  Laura Bass is a 88 y.o. female brought to the ED by her daughter from home with a chief complaint of nonproductive cough times several days and phlegm in throat.  Denies fever/chills, chest pain, shortness of breath, abdominal pain, nausea, vomiting or dizziness.  Denies recent travel, trauma or hormone use.     Past Medical History   Past Medical History:  Diagnosis Date   CAD (coronary artery disease)    History of MI (myocardial infarction)    Hypertension      Active Problem List   Patient Active Problem List   Diagnosis Date Noted   CAD (coronary artery disease) 10/10/2017   Dyspnea 10/10/2017   Essential hypertension 10/10/2017   Hypercholesterolemia 10/10/2017   Myocardial infarction ([place]) 10/10/2017   Skin cancer 10/10/2017   Memory loss or impairment 01/31/2017   Seasonal allergic rhinitis due to pollen 09/04/2016   Personal history of other malignant neoplasm of skin 10/23/2014   Primary osteoarthritis of hands, bilateral 05/27/2014   Angina of effort 02/16/2014   Palpitations 29/79/8921   Diastolic dysfunction 19/41/7408   LVH (left ventricular hypertrophy) 12/30/2012     Past Surgical History   Past Surgical History:  Procedure Laterality Date   APPENDECTOMY     CORONARY ANGIOPLASTY WITH STENT PLACEMENT       Home Medications   Prior to Admission medications   Medication Sig Start Date End Date Taking? Authorizing Provider  azithromycin (ZITHROMAX) 250 MG tablet Take 1 tablet (250 mg total) by mouth daily. 03/31/22  Yes Paulette Blanch, MD  aspirin EC 81 MG tablet Take by mouth.    [provider]  clopidogrel (PLAVIX) 75 MG tablet TAKE 1 TABLET BY MOUTH DAILY 07/16/17   [provider]  ENSURE (ENSURE) Take by mouth. 05/01/16    [provider]  isosorbide mononitrate (IMDUR) 30 MG 24 hr tablet Take 1 tablet (30 mg total) by mouth daily for 15 days. 11/21/20 12/06/20  Carrie Mew, MD  omeprazole (PRILOSEC) 20 MG capsule  07/16/17   [provider]  quinapril (ACCUPRIL) 10 MG tablet  09/22/17   [provider]  simvastatin (ZOCOR) 20 MG tablet  08/22/17   [provider]  spironolactone (ALDACTONE) 25 MG tablet TK 1/2 T PO ONCE D 07/24/17   [provider]     Allergies  Atorvastatin, Oxybutynin chloride, and Spironolactone   Family History  No family history on file.   Physical Exam  Triage Vital Signs: ED Triage Vitals  Enc Vitals Group     BP 03/31/22 0122 134/78     Pulse Rate 03/31/22 0122 62     Resp 03/31/22 0122 18     Temp 03/31/22 0122 98 F (36.7 C)     Temp Source 03/31/22 0122 Oral     SpO2 03/31/22 0122 98 %     Weight 03/31/22 0123 130 lb (59 kg)     Height 03/31/22 0123 '5\' 5"'$  (1.651 m)     Head Circumference --      Peak Flow --      Pain Score 03/31/22 0123 0     Pain Loc --      Pain Edu? --      Excl. in [place]? --  Updated Vital Signs: BP 134/78 (BP Location: Right Arm)   Pulse 62   Temp 98 F (36.7 C) (Oral)   Resp 18   Ht '5\' 5"'$  (1.651 m)   Wt 59 kg   SpO2 98%   BMI 21.63 kg/m    General: Awake, no distress.  Appears younger than stated age. CV:  RRR.  Good peripheral perfusion.  Resp:  Normal effort.  CTAB.  Dry cough noted. Abd:  Nontender.  No distention.  Other:  Bilateral calves are nontender and nonswollen.   ED Results / Procedures / Treatments  Labs (all labs ordered are listed, but only abnormal results are displayed) Labs Reviewed  CBC WITH DIFFERENTIAL/PLATELET - Abnormal; Notable for the following components:      Result Value   HCT 35.6 (*)    All other components within normal limits  COMPREHENSIVE METABOLIC PANEL - Abnormal; Notable for the following components:   Glucose, Bld 101 (*)    BUN 24 (*)     All other components within normal limits  RESP PANEL BY RT-PCR (FLU A&B, COVID) ARPGX2  BRAIN NATRIURETIC PEPTIDE  TROPONIN I (HIGH SENSITIVITY)     EKG  ED ECG REPORT I, Madlyn Crosby J, the attending physician, personally viewed and interpreted this ECG.   Date: 03/31/2022  EKG Time: 0123  Rate: 62  Rhythm: normal sinus rhythm  Axis: Normal  Intervals:none  ST&T Change: Nonspecific    RADIOLOGY I have independently visualized and interpreted patient's chest x-ray as well as noted the radiology interpretation:  X-ray: No acute cardiopulmonary process; COPD  Official radiology report(s): DG Chest Portable 1 View  Result Date: 03/31/2022 CLINICAL DATA:  Shortness of breath EXAM: PORTABLE CHEST 1 VIEW COMPARISON:  Radiographs 11/21/2020 FINDINGS: Bibasilar atelectasis/scarring. Otherwise no focal consolidation, pleural effusion, or pneumothorax. Similar chronic interstitial thickening. Stable mild cardiomegaly. Aortic atherosclerotic calcification. No acute osseous abnormality. IMPRESSION: No active disease.  COPD. Electronically Signed   By: Placido Sou M.D.   On: 03/31/2022 02:16     PROCEDURES:  Critical Care performed: No  Procedures   MEDICATIONS ORDERED IN ED: Medications  azithromycin (ZITHROMAX) tablet 500 mg (has no administration in time range)     IMPRESSION / MDM / ASSESSMENT AND PLAN / ED COURSE  I reviewed the triage vital signs and the nursing notes.                             88 year old well-appearing female presenting with cough times several days. Differential includes, but is not limited to, viral syndrome, bronchitis including COPD exacerbation, pneumonia, reactive airway disease including asthma, CHF including exacerbation with or without pulmonary/interstitial edema, pneumothorax, ACS, thoracic trauma, and pulmonary embolism.  I have personally reviewed patient's records and note a PCP office visit on 02/08/2022 for venous  insufficiency.  Patient's presentation is most consistent with acute complicated illness / injury requiring diagnostic workup.  Laboratory results demonstrate normal WBC 5.1, normal electrolytes, negative troponin, negative BNP, negative respiratory panel, unremarkable chest x-ray.  Patient is well-appearing, jovial, animated.  Not complaining of chest pain; do not feel repeat troponin is warranted.  Will treat with Z-Pak and patient will follow-up closely with her PCP.  Strict return precautions given.  Patient and daughter verbalized understanding agree with plan of care.      FINAL CLINICAL IMPRESSION(S) / ED DIAGNOSES   Final diagnoses:  Bronchitis     Rx / DC Orders   ED  Discharge Orders          Ordered    azithromycin (ZITHROMAX) 250 MG tablet  Daily        03/31/22 3601             Note:  This document was prepared using Dragon voice recognition software and may include unintentional dictation errors.   Paulette Blanch, MD 03/31/22 [phone]

## 2022-03-31 NOTE — ED Triage Notes (Signed)
Video interpreter in use. Pt to triage via wheelchair with c/o shortness of breath. Onset apx 1130 tonight.  Daughter reports pt has had non productive cough for several days, " like she's trying to clear her throat."   Pt denies chest pain. Does not wear 02 at home.  Resp even and unlabored in triage. NAD noted at this time.

## 2022-03-31 NOTE — Discharge Instructions (Signed)
Finish your antibiotics as instructed.  Take your next dose Saturday morning.  Return to the ER for worsening symptoms, persistent vomiting, difficulty breathing or other concerns.

## 2022-04-02 IMAGING — CR DG CHEST 2V
2 series · 2 of 2 positions shown · non-contrast
Comparison: Chest radiograph 11/16/2020 and earlier.

CLINICAL DATA: [AGE] female with shortness of breath onset
last night.

EXAM:
CHEST - 2 VIEW

[dg chest 2 view]
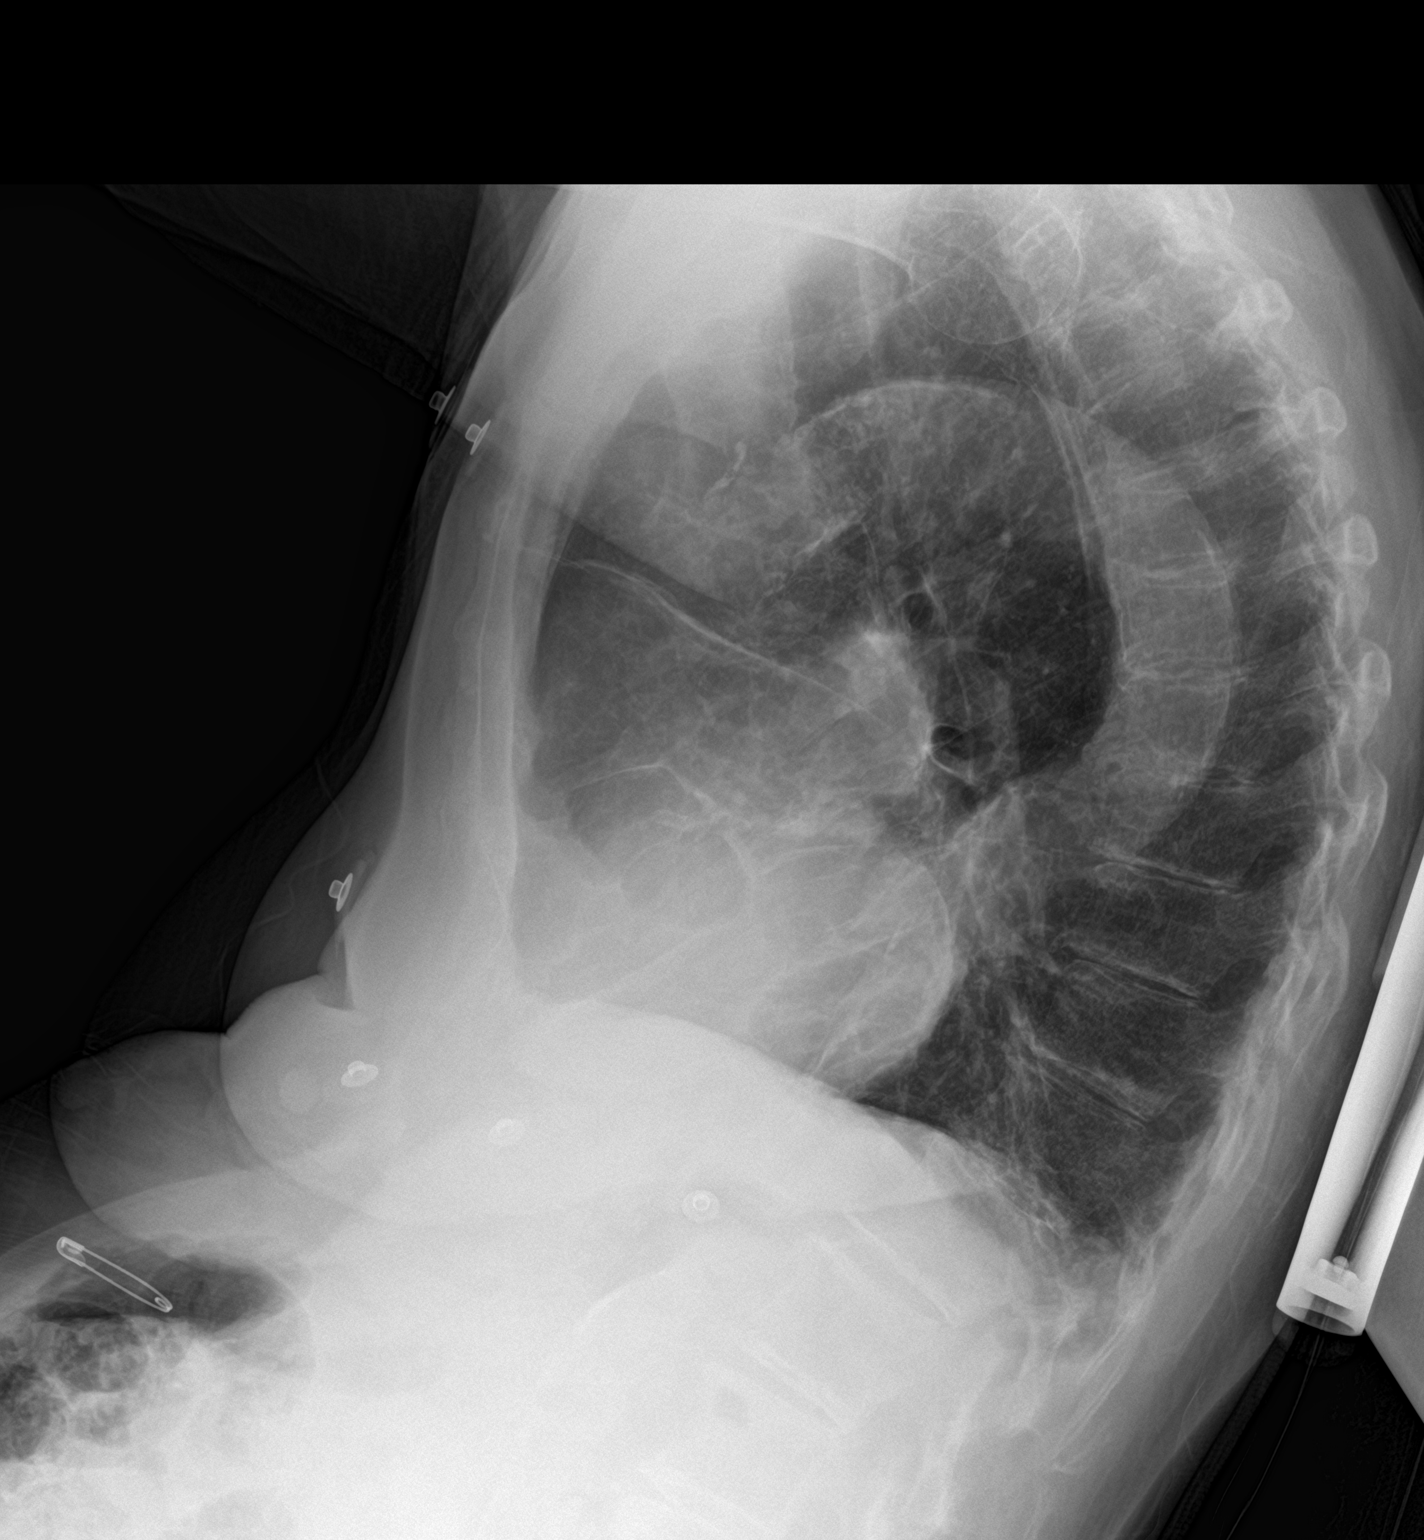

[chest ap]
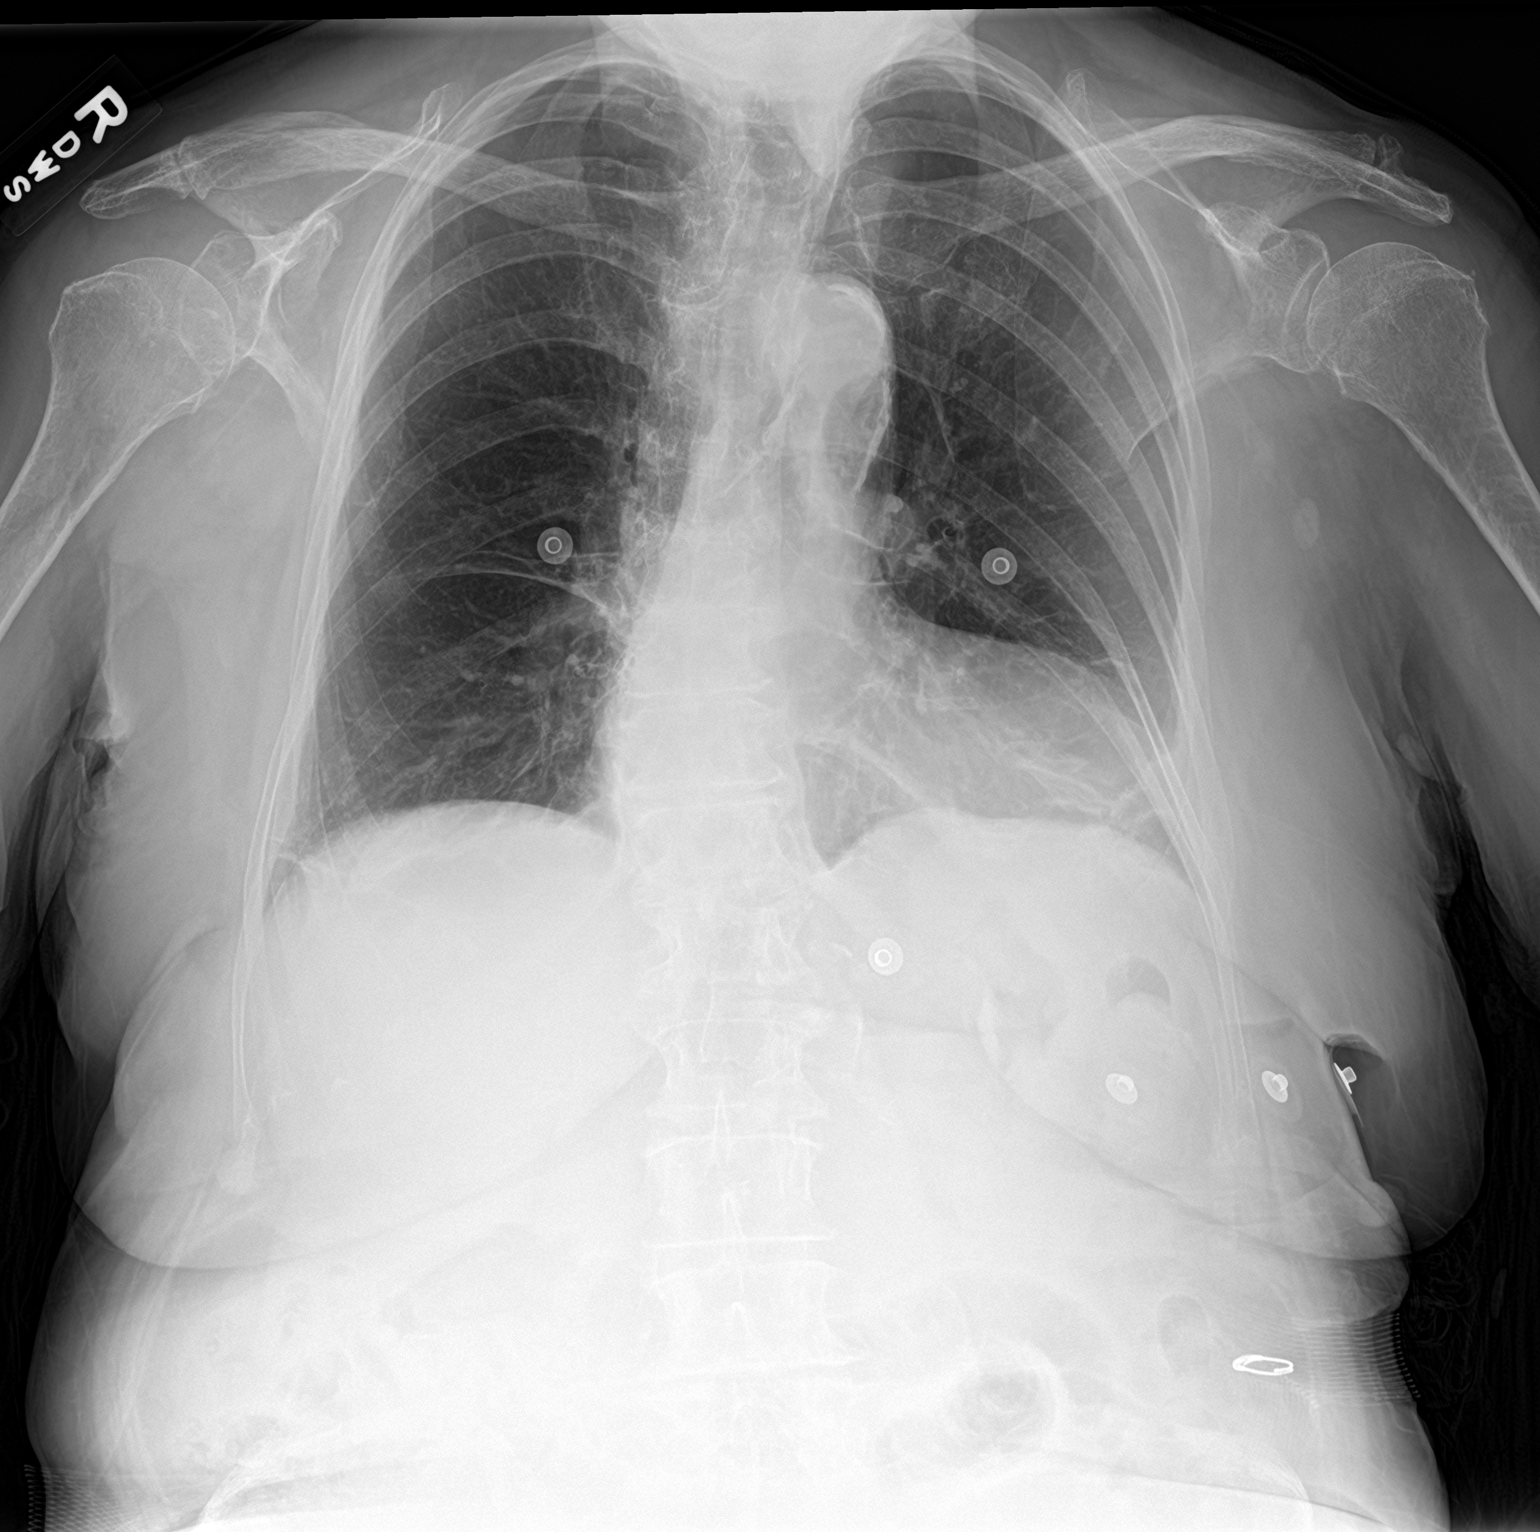

[2 of 2 positions shown; findings below may reference images not displayed]

FINDINGS: Chronically large lung volumes, tortuous thoracic aorta and other
mediastinal contours. Lower lung volumes today compared to 9393. No
pneumothorax, pulmonary edema, pleural effusion or confluent
pulmonary opacity. Stable linear basilar opacities compatible with
atelectasis or scarring.

No acute osseous abnormality identified. Calcified aortic
atherosclerosis. Negative visible bowel gas pattern.
IMPRESSION: Chronic lung disease with hyperinflation. Lower lung volumes. No
acute cardiopulmonary abnormality.

## 2023-04-30 ENCOUNTER — Emergency Department
Admission: EM | Admit: 2023-04-30 | Discharge: 2023-04-30 | Disposition: A | Discharge status: Home / Self Care | Payer: Medicaid Other | Attending: Emergency Medicine | Admitting: Emergency Medicine

## 2023-04-30 ENCOUNTER — Emergency Department: Payer: Medicaid Other

## 2023-04-30 ENCOUNTER — Other Ambulatory Visit: Payer: Self-pay

## 2023-04-30 DIAGNOSIS — S0990XA Unspecified injury of head, initial encounter: Secondary | ICD-10-CM | POA: Diagnosis present

## 2023-04-30 DIAGNOSIS — W01198A Fall on same level from slipping, tripping and stumbling with subsequent striking against other object, initial encounter: Secondary | ICD-10-CM | POA: Diagnosis not present

## 2023-04-30 DIAGNOSIS — W19XXXA Unspecified fall, initial encounter: Secondary | ICD-10-CM

## 2023-04-30 NOTE — ED Triage Notes (Signed)
Pt here after a fall. Family states she got up too quickly and fell forward and hit her head. Pt denies LOC. Pt is not on a blood thinner. Pt alert and oriented in triage.

## 2023-04-30 NOTE — ED Provider Triage Note (Signed)
Emergency Medicine Provider Triage Evaluation Note  Laura Bass , a 89 y.o. female  was evaluated in triage.  Pt complains of fall while trying to get out of her recliner and walk to her bathroom.  Complains of posterior head pain.  No blood thinners.  No vomiting or dizziness.  Review of Systems  Positive:  Negative:   Physical Exam  BP 120/87   Pulse 71   Temp 98.6 F (37 C)   Resp 17   Ht 5\' 5"  (1.651 m)   Wt 59 kg   SpO2 95%   BMI 21.64 kg/m  Gen:   Awake, no distress well-appearing Resp:  Normal effort  MSK:   Moves extremities without difficulty  Other:  No scalp hematoma or laceration to the posterior scalp.  Mild tenderness.  No cervical midline tenderness.  Medical Decision Making  Medically screening exam initiated at 3:39 PM.  Appropriate orders placed.  Laura Bass was informed that the remainder of the evaluation will be completed by another provider, this initial triage assessment does not replace that evaluation, and the importance of remaining in the ED until their evaluation is complete.     Romeo Apple, [place] A, PA-C 04/30/23 1544

## 2023-04-30 NOTE — ED Provider Notes (Signed)
[facility] Provider Note    Event Date/Time   First MD Initiated Contact with Patient 04/30/23 [phone]     (approximate)   History   Fall   HPI Laura Bass is a 89 y.o. female resenting today for fall.  Patient presents with daughter and prefers to interpret over using an official interpreter.  She states patient had a fall earlier today and was complaining of pain in her head.  Denies injury elsewhere.  No reported loss of consciousness.  Not on blood thinners.  No nausea, or vomiting.  Is at her normal baseline.  Denies pain any into her extremities, trunk, or back.  Has been able to ambulate since then with the use of her cane which is her baseline.     Physical Exam   Triage Vital Signs: ED Triage Vitals  Encounter Vitals Group     BP 04/30/23 1536 120/87     Systolic BP Percentile --      Diastolic BP Percentile --      Pulse Rate 04/30/23 1536 71     Resp 04/30/23 1536 17     Temp 04/30/23 1536 98.6 F (37 C)     Temp src --      SpO2 04/30/23 1536 95 %     Weight 04/30/23 1537 130 lb 1.1 oz (59 kg)     Height 04/30/23 1537 5\' 5"  (1.651 m)     Head Circumference --      Peak Flow --      Pain Score --      Pain Loc --      Pain Education --      Exclude from Growth Chart --     Most recent vital signs: Vitals:   04/30/23 1536  BP: 120/87  Pulse: 71  Resp: 17  Temp: 98.6 F (37 C)  SpO2: 95%   I have reviewed the vital signs. General:  Awake, alert, no acute distress. Head:  Normocephalic, Atraumatic.  Nontender to palpation throughout the head.  No obvious hematomas. EENT:  PERRL, EOMI, Oral mucosa pink and moist, Neck is supple. Cardiovascular: Regular rate, 2+ distal pulses. Respiratory:  Normal respiratory effort, symmetrical expansion, no distress.   Extremities:  Moving all four extremities through full ROM without pain.  No tenderness palpation to C, T, or L-spine.  Nontender throughout chest wall or abdomen.   No tenderness palpation to bilateral upper or lower extremities. Neuro:  Alert and oriented.  Interacting appropriately.   Skin:  Warm, dry, no rash.   Psych: Appropriate affect.    ED Results / Procedures / Treatments   Labs (all labs ordered are listed, but only abnormal results are displayed) Labs Reviewed - No data to display   EKG    RADIOLOGY Independently interpreted CT imaging with no acute traumatic pathology   PROCEDURES:  Critical Care performed: No  Procedures   MEDICATIONS ORDERED IN ED: Medications - No data to display   IMPRESSION / MDM / ASSESSMENT AND PLAN / ED COURSE  I reviewed the triage vital signs and the nursing notes.                              Differential diagnosis includes, but is not limited to, ICH, soft tissue hematoma, cervical spine injury  Patient's presentation is most consistent with acute complicated illness / injury requiring diagnostic workup.  Patient is a 89 year old  female who had a ground-level mechanical fall at home today.  No loss of consciousness and not on any blood thinners.  At her normal mentation baseline.  Has ambulated since then.  No other acute symptoms.  Nontender throughout the entire body with no obvious deformities anywhere.  CT head and cervical spine ordered in triage show no acute traumatic pathology.  Patient otherwise at her baseline and able to ambulate and tolerate p.o.  Safe for discharge with her daughter and given strict return precautions.  Told to follow-up with her primary care provider as needed.      FINAL CLINICAL IMPRESSION(S) / ED DIAGNOSES   Final diagnoses:  Fall, initial encounter  Injury of head, initial encounter     Rx / DC Orders   ED Discharge Orders     None        Note:  This document was prepared using Dragon voice recognition software and may include unintentional dictation errors.   Janith Lima, MD 04/30/23 [phone]

## 2023-10-30 ENCOUNTER — Other Ambulatory Visit: Payer: Self-pay

## 2023-10-30 ENCOUNTER — Emergency Department

## 2023-10-30 ENCOUNTER — Emergency Department
Admission: EM | Admit: 2023-10-30 | Discharge: 2023-10-30 | Disposition: A | Discharge status: Home / Self Care | Attending: Emergency Medicine | Admitting: Emergency Medicine

## 2023-10-30 DIAGNOSIS — I10 Essential (primary) hypertension: Secondary | ICD-10-CM | POA: Insufficient documentation

## 2023-10-30 DIAGNOSIS — W1809XA Striking against other object with subsequent fall, initial encounter: Secondary | ICD-10-CM | POA: Insufficient documentation

## 2023-10-30 DIAGNOSIS — S0990XA Unspecified injury of head, initial encounter: Secondary | ICD-10-CM | POA: Insufficient documentation

## 2023-10-30 DIAGNOSIS — M546 Pain in thoracic spine: Secondary | ICD-10-CM | POA: Insufficient documentation

## 2023-10-30 DIAGNOSIS — W19XXXA Unspecified fall, initial encounter: Secondary | ICD-10-CM

## 2023-10-30 DIAGNOSIS — I2510 Atherosclerotic heart disease of native coronary artery without angina pectoris: Secondary | ICD-10-CM | POA: Insufficient documentation

## 2023-10-30 NOTE — ED Triage Notes (Signed)
 Pt brought in by EMS from home d/t fall, daughter reports pt fell while trying to get to the bathroom. The fall was unwitnessed and pt daughter reports pt c/o pain in her back and head. Pt is on plavix.

## 2023-10-30 NOTE — ED Provider Notes (Signed)
 [facility] Provider Note    Event Date/Time   First MD Initiated Contact with Patient 10/30/23 [phone]     (approximate)  History   Chief Complaint: Fall  HPI  Laura Bass is a 89 y.o. female with a past medical history of CAD, hypertension, presents to the emergency department after a fall.  According to the patient and daughter who is here with the patient she believes the patient was trying to get up to use the restroom and had a fall, hit the nightstand with her back and then fell to the ground.  At the time patient was complaining of some back pain and head pain.  Here the patient is not complaining of any discomfort.  Patient is awake alert, acting at her baseline per daughter.  Daughter states patient takes clopidogrel as her only anticoagulant.  Physical Exam   Triage Vital Signs: ED Triage Vitals  Encounter Vitals Group     BP 10/30/23 0949 (!) 188/78     Systolic BP Percentile --      Diastolic BP Percentile --      Pulse Rate 10/30/23 0949 (!) 57     Resp 10/30/23 0949 18     Temp 10/30/23 0949 97.8 F (36.6 C)     Temp Source 10/30/23 0949 Oral     SpO2 10/30/23 0949 96 %     Weight 10/30/23 0950 121 lb 4.1 oz (55 kg)     Height --      Head Circumference --      Peak Flow --      Pain Score 10/30/23 0950 6     Pain Loc --      Pain Education --      Exclude from Growth Chart --     Most recent vital signs: Vitals:   10/30/23 0949  BP: (!) 188/78  Pulse: (!) 57  Resp: 18  Temp: 97.8 F (36.6 C)  SpO2: 96%    General: Awake, no distress.  CV:  Good peripheral perfusion.  Regular rate and rhythm  Resp:  Normal effort.  Equal breath sounds bilaterally.  Abd:  No distention.  Soft, nontender.  No rebound or guarding. Other:  No C T or L-spine tenderness.  There is some erythema across the thoracic spine and left side of the patient's back consistent with likely bruise but no tenderness to this area.  No step-off or  deformity.   ED Results / Procedures / Treatments   RADIOLOGY  I reviewed interpret the CT of the images.  No bleed seen on my evaluation. Radiology is read the CT scan of the head and C-spine is negative for acute abnormality. X-rays of the T and L-spine are negative for fracture.   MEDICATIONS ORDERED IN ED: Medications - No data to display   IMPRESSION / MDM / ASSESSMENT AND PLAN / ED COURSE  I reviewed the triage vital signs and the nursing notes.  Patient's presentation is most consistent with acute presentation with potential threat to life or bodily function.  Patient presents the emergency department after a fall at home.  Per daughter she believes patient hit her head and was complaining of back pain earlier.  Patient does have some erythema to the back consistent with likely impact/mild bruising but no significant tenderness to any area.  Will obtain x-rays of the thoracic and lumbar spine as a precaution.  Given the fall and the patient's age was possible head injury  on clopidogrel we will obtain a CT scan of the head and C-spine as well.  Daughter agreeable to plan of care.  Great range of motion in all extremities.  No other significant finding.  Patient's imaging is negative.  Patient daughter is reassured by findings.  Will have the patient follow-up with her doctor.  Discussed Tylenol if needed for discomfort/bruising.  FINAL CLINICAL IMPRESSION(S) / ED DIAGNOSES   Fall Head injury   Note:  This document was prepared using Dragon voice recognition software and may include unintentional dictation errors.   Ruth Cove, MD 10/30/23 1046

## 2024-01-23 NOTE — Progress Notes (Signed)
 Chief Complaint  Patient presents with  . Nutrition Counseling    Patient does not like the Ensure that has been prescribed; would like to request a change. [facility] Standard, 1.4, vanilla. Was using TID. If able to prescribe, would like the order sent to [facility].     Subjective  Laura Bass is a 89 y.o. female who presents for Nutrition Counseling (Patient does not like the Ensure that has been prescribed; would like to request a change. [facility] Standard, 1.4, vanilla. Was using TID. If able to prescribe, would like the order sent to [facility]. ) HPI History of Present Illness Laura Bass is a 89 year old female with coronary artery disease who presents for medication refills and nutritional supplement assistance.  Medication management - Requires refills for clopidogrel and nitroglycerin - Has not used nitroglycerin recently but supply is current - Ran out of clopidogrel  Nutritional supplementation - Uses Ensure for nutritional supplementation - Prefers [employer] but finds it expensive - Consumes three servings daily - Caregiver is seeking assistance to obtain [employer] through [place], as her husband receives his supplements through her  Wt Readings from Last 10 Encounters:  01/23/24 51.6 kg (113 lb 12.1 oz)  01/04/24 50 kg (110 lb 3.7 oz)  01/07/23 58.1 kg (128 lb 1.4 oz)  08/02/22 61 kg (134 lb 7.7 oz)  05/24/22 60.3 kg (132 lb 15 oz)  02/08/22 60.3 kg (132 lb 15 oz)  09/29/21 61.6 kg (135 lb 12.8 oz)  05/18/21 61.2 kg (135 lb)  01/17/21 61.7 kg (136 lb)  11/26/20 61.8 kg (136 lb 3.9 oz)   \  Review of Systems  Respiratory:  Negative for chest tightness and shortness of breath.     Patient Active Problem List  Diagnosis  . CAD (coronary artery disease)  . Hypercholesterolemia  . Essential hypertension  . Dyspnea  . LVH (left ventricular hypertrophy)  . Diastolic dysfunction  . Skin cancer  . History of MI (myocardial  infarction)  . Primary osteoarthritis of hands, bilateral  . Personal history of other malignant neoplasm of skin  . Seasonal allergic rhinitis due to pollen  . Memory loss or impairment    Outpatient Medications Prior to Visit  Medication Sig Dispense Refill  . aspirin 81 MG EC tablet Take 81 mg by mouth once daily.    . compression socks, medium Misc Use as directed for fluid retention 2 each 4  . FUROsemide (LASIX) 20 MG tablet TAKE 1 TABLET(20 MG) BY MOUTH EVERY DAY AS NEEDED 30 tablet 5  . Lactobacillus no.46/B.animalis (PROBIOTIC-10 ORAL) Take by mouth    . lisinopriL (ZESTRIL) 5 MG tablet Take 1 tablet (5 mg total) by mouth once daily 90 tablet 3  . multivitamin capsule Take 1 capsule by mouth once daily    . clopidogreL (PLAVIX) 75 mg tablet Take 1 tablet (75 mg total) by mouth once daily 90 tablet 3  . food supplemt, lactose-reduced (ENSURE ORIGINAL) Liqd Take 2 Bottles by mouth once daily. 60 Bottle 11  . nitroGLYcerin (NITROSTAT) 0.4 MG SL tablet TAKE 1 TABLET UNDER THE TONGUE EVERY 5 MINUTES AS NEEDED FOR CHEST PAIN. MAY TAKE UP TO 3 DOSES 25 tablet 1   No facility-administered medications prior to visit.      Objective  Vitals:   01/23/24 1147 01/23/24 1149  BP:  133/64  Pulse:  76  Weight: 51.6 kg (113 lb 12.1 oz)   PainSc:  0-No pain  Body mass index is 24.62 kg/m.  Home Vitals:     Physical Exam Constitutional:      Appearance: Normal appearance.  HENT:     Head: Normocephalic and atraumatic.  Cardiovascular:     Rate and Rhythm: Normal rate and regular rhythm.     Heart sounds: Murmur heard.  Neurological:     Mental Status: She is alert and oriented to person, place, and time.  Psychiatric:        Mood and Affect: Mood normal.        Behavior: Behavior normal.    Physical Exam CARDIOVASCULAR: Heart sounds normal    Results      Assessment/Plan:   Assessment & Plan Atherosclerotic heart disease with prior stent placement Clopidogrel  essential for stent thrombosis prevention. Nitroglycerin maintained as precaution. - Refilled clopidogrel prescription to [facility] in [place]. - Refilled nitroglycerin prescription to [facility] in [place].  Nutritional supplementation requirement Prefers [employer] over [employer]. Needs assistance with obtaining supplement due to cost concerns. - Attempted to send prescription for Mallie [employer] vanilla nutritional supplement to [facility] in [place] . - Advised to contact office if issues with prescription delivery or different fax number needed.   Diagnoses and all orders for this visit:  Weight loss -     nutritional supplements Liqd; Take 1 bottle of [employer] Standard Nutritional Supplement (Vanilla) by mouth three times daily  Diastolic dysfunction -     nitroGLYcerin (NITROSTAT) 0.4 MG SL tablet; Place 1 tablet (0.4 mg total) under the tongue every 5 (five) minutes as needed for Chest pain May take up to 3 doses.  LVH (left ventricular hypertrophy) -     nitroGLYcerin (NITROSTAT) 0.4 MG SL tablet; Place 1 tablet (0.4 mg total) under the tongue every 5 (five) minutes as needed for Chest pain May take up to 3 doses.  Essential hypertension -     nitroGLYcerin (NITROSTAT) 0.4 MG SL tablet; Place 1 tablet (0.4 mg total) under the tongue every 5 (five) minutes as needed for Chest pain May take up to 3 doses.  Coronary artery disease involving native heart with angina pectoris and documented spasm, unspecified vessel or lesion type () -     nitroGLYcerin (NITROSTAT) 0.4 MG SL tablet; Place 1 tablet (0.4 mg total) under the tongue every 5 (five) minutes as needed for Chest pain May take up to 3 doses.  Angina of effort () -     nitroGLYcerin (NITROSTAT) 0.4 MG SL tablet; Place 1 tablet (0.4 mg total) under the tongue every 5 (five) minutes as needed for Chest pain May take up to 3 doses.  Palpitations -     nitroGLYcerin (NITROSTAT) 0.4 MG SL tablet; Place 1  tablet (0.4 mg total) under the tongue every 5 (five) minutes as needed for Chest pain May take up to 3 doses.  Shortness of breath -     nitroGLYcerin (NITROSTAT) 0.4 MG SL tablet; Place 1 tablet (0.4 mg total) under the tongue every 5 (five) minutes as needed for Chest pain May take up to 3 doses.  Pure hypercholesterolemia -     nitroGLYcerin (NITROSTAT) 0.4 MG SL tablet; Place 1 tablet (0.4 mg total) under the tongue every 5 (five) minutes as needed for Chest pain May take up to 3 doses.  Abnormal nuclear stress test -     nitroGLYcerin (NITROSTAT) 0.4 MG SL tablet; Place 1 tablet (0.4 mg total) under the tongue every 5 (five) minutes as needed  for Chest pain May take up to 3 doses.  Coronary artery disease involving native coronary artery of native heart with angina pectoris with documented spasm () -     clopidogreL (PLAVIX) 75 mg tablet; Take 1 tablet (75 mg total) by mouth once daily  Coronary artery disease involving native coronary artery of native heart without angina pectoris  Menopause    This visit was coded based on medical decision making (MDM).           Future Appointments     Date/Time Provider Department Center Visit Type   02/13/2024 11:00 AM (Arrive by 10:45 AM) Calista Bradley, MD [facility] OFFICE VISIT       There are no Patient Instructions on file for this visit.  An after visit summary was provided for the patient either in written format (printed) or through [facility].  This note has been created using automated tools and reviewed for accuracy by [facility].

## 2024-01-25 ENCOUNTER — Emergency Department

## 2024-01-25 ENCOUNTER — Other Ambulatory Visit: Payer: Self-pay

## 2024-01-25 ENCOUNTER — Emergency Department
Admission: EM | Admit: 2024-01-25 | Discharge: 2024-01-25 | Disposition: A | Discharge status: Home / Self Care | Attending: Emergency Medicine | Admitting: Emergency Medicine

## 2024-01-25 ENCOUNTER — Encounter: Payer: Self-pay | Admitting: Emergency Medicine

## 2024-01-25 DIAGNOSIS — I10 Essential (primary) hypertension: Secondary | ICD-10-CM | POA: Insufficient documentation

## 2024-01-25 DIAGNOSIS — R0602 Shortness of breath: Secondary | ICD-10-CM | POA: Insufficient documentation

## 2024-01-25 DIAGNOSIS — I2510 Atherosclerotic heart disease of native coronary artery without angina pectoris: Secondary | ICD-10-CM | POA: Diagnosis not present

## 2024-01-25 DIAGNOSIS — Z5321 Procedure and treatment not carried out due to patient leaving prior to being seen by health care provider: Secondary | ICD-10-CM | POA: Diagnosis not present

## 2024-01-25 LAB — CBC WITH DIFFERENTIAL/PLATELET
Abs Immature Granulocytes: 0.01 K/uL (ref 0.00–0.07)
Basophils Absolute: 0.1 K/uL (ref 0.0–0.1)
Basophils Relative: 1 %
Eosinophils Absolute: 0.2 K/uL (ref 0.0–0.5)
Eosinophils Relative: 4 %
HCT: 40 % (ref 36.0–46.0)
Hemoglobin: 13.4 g/dL (ref 12.0–15.0)
Immature Granulocytes: 0 %
Lymphocytes Relative: 30 %
Lymphs Abs: 1.6 K/uL (ref 0.7–4.0)
MCH: 31.2 pg (ref 26.0–34.0)
MCHC: 33.5 g/dL (ref 30.0–36.0)
MCV: 93.2 fL (ref 80.0–100.0)
Monocytes Absolute: 0.5 K/uL (ref 0.1–1.0)
Monocytes Relative: 9 %
Neutro Abs: 3 K/uL (ref 1.7–7.7)
Neutrophils Relative %: 56 %
Platelets: 177 K/uL (ref 150–400)
RBC: 4.29 MIL/uL (ref 3.87–5.11)
RDW: 13.4 % (ref 11.5–15.5)
WBC: 5.3 K/uL (ref 4.0–10.5)
nRBC: 0 % (ref 0.0–0.2)

## 2024-01-25 LAB — RESP PANEL BY RT-PCR (RSV, FLU A&B, COVID)  RVPGX2
Influenza A by PCR: NEGATIVE
Influenza B by PCR: NEGATIVE
Resp Syncytial Virus by PCR: NEGATIVE
SARS Coronavirus 2 by RT PCR: NEGATIVE

## 2024-01-25 LAB — CBC
HCT: 37.4 % (ref 36.0–46.0)
Hemoglobin: 12.6 g/dL (ref 12.0–15.0)
MCH: 31.3 pg (ref 26.0–34.0)
MCHC: 33.7 g/dL (ref 30.0–36.0)
MCV: 93 fL (ref 80.0–100.0)
Platelets: 162 K/uL (ref 150–400)
RBC: 4.02 MIL/uL (ref 3.87–5.11)
RDW: 13.4 % (ref 11.5–15.5)
WBC: 6.8 K/uL (ref 4.0–10.5)
nRBC: 0 % (ref 0.0–0.2)

## 2024-01-25 LAB — BASIC METABOLIC PANEL WITH GFR
Anion gap: 9 (ref 5–15)
BUN: 18 mg/dL (ref 8–23)
CO2: 26 mmol/L (ref 22–32)
Calcium: 9.4 mg/dL (ref 8.9–10.3)
Chloride: 101 mmol/L (ref 98–111)
Creatinine, Ser: 0.65 mg/dL (ref 0.44–1.00)
GFR, Estimated: >60 mL/min (ref >60)
Glucose, Bld: 88 mg/dL (ref 70–99)
Potassium: 3.7 mmol/L (ref 3.5–5.1)
Sodium: 136 mmol/L (ref 135–145)

## 2024-01-25 LAB — D-DIMER, QUANTITATIVE: D-Dimer, Quant: 1.39 ug{FEU}/mL — ABNORMAL HIGH (ref 0.00–0.50)

## 2024-01-25 LAB — COMPREHENSIVE METABOLIC PANEL WITH GFR
ALT: 19 U/L (ref 0–44)
AST: 21 U/L (ref 15–41)
Albumin: 3.9 g/dL (ref 3.5–5.0)
Alkaline Phosphatase: 52 U/L (ref 38–126)
Anion gap: 11 (ref 5–15)
BUN: 19 mg/dL (ref 8–23)
CO2: 28 mmol/L (ref 22–32)
Calcium: 10 mg/dL (ref 8.9–10.3)
Chloride: 102 mmol/L (ref 98–111)
Creatinine, Ser: 0.73 mg/dL (ref 0.44–1.00)
GFR, Estimated: >60 mL/min (ref >60)
Glucose, Bld: 98 mg/dL (ref 70–99)
Potassium: 3.9 mmol/L (ref 3.5–5.1)
Sodium: 141 mmol/L (ref 135–145)
Total Bilirubin: 0.5 mg/dL (ref 0.0–1.2)
Total Protein: 6.8 g/dL (ref 6.5–8.1)

## 2024-01-25 LAB — TROPONIN I (HIGH SENSITIVITY)
Troponin I (High Sensitivity): 8 ng/L (ref <18)
Troponin I (High Sensitivity): 6 ng/L (ref <18)
Troponin I (High Sensitivity): 7 ng/L (ref <18)

## 2024-01-25 LAB — BRAIN NATRIURETIC PEPTIDE: B Natriuretic Peptide: 62.3 pg/mL (ref 0.0–100.0)

## 2024-01-25 MED ORDER — IOHEXOL 350 MG/ML SOLN
75.0000 mL | Freq: Once | INTRAVENOUS | Status: AC | PRN
  Administered 2024-01-25: 75 mL via INTRAVENOUS

## 2024-01-25 NOTE — ED Triage Notes (Signed)
 Pt arrives via [facility], per their report, EMS was called out for CP tonight. Pt is spanish speaking and when EMS got there, per her daughter, she is not having CP, she has a fever, SOB and hurting all over. En route, 12 lead unremarkable. CBG 97. IV established in the left forearm. 164/107, 95% ra, hr 72. Pt was just seen early hours of this morning.

## 2024-01-25 NOTE — ED Notes (Signed)
 Pt up to bedside commode

## 2024-01-25 NOTE — ED Triage Notes (Signed)
 During triage daughter voiced that she think pts plavix is causing her symptoms and that pt needs to see a cardiologist during this ED visit.

## 2024-01-25 NOTE — ED Triage Notes (Signed)
 Pt arrives via ems from home. Per daughter pt was c/o shortness of breath.. pt spanish speaking and yelling out. Pt denies pain, endorses weakness per daughter.

## 2024-01-25 NOTE — Discharge Instructions (Addendum)
 Fortunately your evaluation in the emergency department did not show any emergency heart or lung problems that account for your shortness of breath tonight.  Please follow-up with your primary doctor for an appointment this week  Thank you for choosing us  for your health care today!  Please see your primary doctor this week for a follow up appointment.   If you have any new, worsening, or unexpected symptoms call your doctor right away or come back to the emergency department for reevaluation.  It was my pleasure to care for you today.   Ginnie EDISON Cyrena, MD

## 2024-01-25 NOTE — ED Provider Notes (Signed)
 [facility] Provider Note    Event Date/Time   First MD Initiated Contact with Patient 01/25/24 [phone]     (approximate)   History   Shortness of Breath   HPI  Laura Bass is a 89 y.o. female   Past medical history of CAD, hypertension who presents emergency department with shortness of breath.  She was in her regular state of health throughout the day and when her daughter put her to bed tonight, she complained of shortness of breath.  The daughter repositioned her as she knows that laying flat causes her mother to feel short of breath.  She continued to complain of shortness of breath so they called EMS and brought her to the emergency department.  Per EMS report, normal vital signs en route.  Patient denies chest pain, respiratory infectious symptoms, any other acute medical complaints.  I offered to use a Spanish interpreter with the patient but she prefers to use her daughter who is at bedside  Independent Historian contributed to assessment above: EMS and daughter as above  External Medical Documents Reviewed: Outpatient note from earlier this week      Physical Exam   Triage Vital Signs: ED Triage Vitals [01/25/24 0123]  Encounter Vitals Group     BP (!) 177/65     Girls Systolic BP Percentile      Girls Diastolic BP Percentile      Boys Systolic BP Percentile      Boys Diastolic BP Percentile      Pulse Rate 66     Resp 16     Temp 97.7 F (36.5 C)     Temp Source Oral     SpO2 98 %     Weight      Height      Head Circumference      Peak Flow      Pain Score      Pain Loc      Pain Education      Exclude from Growth Chart     Most recent vital signs: Vitals:   01/25/24 0236 01/25/24 0400  BP:  (!) 161/76  Pulse:  (!) 56  Resp:  19  Temp:    SpO2: 98% 96%    General: Awake, no distress.  CV:  Good peripheral perfusion.  Resp:  Normal effort.  Abd:  No distention.  Other:  Breathing comfortably on room  air, hypertensive otherwise vital signs normal no hypoxemia.  No respiratory distress.  Clear lungs without focality or wheezing.  Soft benign abdominal exam.  Appears euvolemic overall with no peripheral edema.   ED Results / Procedures / Treatments   Labs (all labs ordered are listed, but only abnormal results are displayed) Labs Reviewed  D-DIMER, QUANTITATIVE - Abnormal; Notable for the following components:      Result Value   D-Dimer, Quant 1.39 (*)    All other components within normal limits  RESP PANEL BY RT-PCR (RSV, FLU A&B, COVID)  RVPGX2  COMPREHENSIVE METABOLIC PANEL WITH GFR  CBC WITH DIFFERENTIAL/PLATELET  BRAIN NATRIURETIC PEPTIDE  TROPONIN I (HIGH SENSITIVITY)  TROPONIN I (HIGH SENSITIVITY)     I ordered and reviewed the above labs they are notable for cell counts are unremarkable  EKG  ED ECG REPORT I, Ginnie Shams, the attending physician, personally viewed and interpreted this ECG.   Date: 01/25/2024  EKG Time: 0123  Rate: 63  Rhythm: sinus  Axis: nl  Intervals:nl  ST&T Change: no stemi    RADIOLOGY I independently reviewed and interpreted chest x-ray and I see no obvious focality pneumothorax I also reviewed radiologist's formal read.   PROCEDURES:  Critical Care performed: No  Procedures   MEDICATIONS ORDERED IN ED: Medications  iohexol  (OMNIPAQUE ) 350 MG/ML injection 75 mL (75 mLs Intravenous Contrast Given 01/25/24 0310)     IMPRESSION / MDM / ASSESSMENT AND PLAN / ED COURSE  I reviewed the triage vital signs and the nursing notes.                                Patient's presentation is most consistent with acute presentation with potential threat to life or bodily function.  Differential diagnosis includes, but is not limited to, ACS, PE, respiratory infection, pneumothorax, CHF exacerbation   The patient is on the cardiac monitor to evaluate for evidence of arrhythmia and/or significant heart rate changes.  MDM:     Apparently she had transient shortness of breath that she is no longer complaining shortness of breath, may have been positional, has a history of CAD, I think it prudent to check for ACS with a EKG and serial troponins, check D-dimer as a start for PE given low to moderate clinical suspicion.  No respiratory infectious symptoms but will check viral swabs, chest x-ray, basic labs.  Consider CHF exacerbation but appears euvolemic overall, check a BNP and chest x-ray.   Workup unremarkable patient resting comfortably on cardiac monitor without any events during her stay.  I considered admission however given her stability, resolution of symptoms, and unremarkable workup for cardiopulmonary emergencies as above, I believe she can be discharged and follow-up with her PMD and return with any new or worsening symptoms.     FINAL CLINICAL IMPRESSION(S) / ED DIAGNOSES   Final diagnoses:  SOB (shortness of breath)     Rx / DC Orders   ED Discharge Orders     None        Note:  This document was prepared using Dragon voice recognition software and may include unintentional dictation errors.    Cyrena Mylar, MD 01/25/24 [phone]

## 2024-01-26 ENCOUNTER — Emergency Department
Admission: EM | Admit: 2024-01-26 | Discharge: 2024-01-26 | Discharge status: Against Medical Advice | Attending: Emergency Medicine | Admitting: Emergency Medicine

## 2024-01-26 NOTE — ED Notes (Signed)
 Daughter requested for IV be removed so she could take pt due to the wait time. RN removed IV. Daughter assisted pt into wheelchair and escorted pt out of the ED waiting room.

## 2024-06-10 ENCOUNTER — Emergency Department

## 2024-06-10 ENCOUNTER — Observation Stay
Admission: EM | Admit: 2024-06-10 | Discharge: 2024-06-10 | Discharge status: Against Medical Advice | Attending: Hospitalist | Admitting: Hospitalist

## 2024-06-10 ENCOUNTER — Other Ambulatory Visit: Payer: Self-pay

## 2024-06-10 ENCOUNTER — Encounter: Payer: Self-pay | Admitting: Emergency Medicine

## 2024-06-10 DIAGNOSIS — Z7982 Long term (current) use of aspirin: Secondary | ICD-10-CM | POA: Diagnosis not present

## 2024-06-10 DIAGNOSIS — R1085 Abdominal pain of multiple sites: Secondary | ICD-10-CM

## 2024-06-10 DIAGNOSIS — K819 Cholecystitis, unspecified: Principal | ICD-10-CM

## 2024-06-10 DIAGNOSIS — I700 Atherosclerosis of aorta: Secondary | ICD-10-CM | POA: Insufficient documentation

## 2024-06-10 DIAGNOSIS — K8000 Calculus of gallbladder with acute cholecystitis without obstruction: Secondary | ICD-10-CM | POA: Diagnosis not present

## 2024-06-10 DIAGNOSIS — I10 Essential (primary) hypertension: Secondary | ICD-10-CM | POA: Insufficient documentation

## 2024-06-10 DIAGNOSIS — R112 Nausea with vomiting, unspecified: Secondary | ICD-10-CM

## 2024-06-10 DIAGNOSIS — M545 Low back pain, unspecified: Secondary | ICD-10-CM

## 2024-06-10 DIAGNOSIS — R109 Unspecified abdominal pain: Secondary | ICD-10-CM | POA: Diagnosis present

## 2024-06-10 DIAGNOSIS — K810 Acute cholecystitis: Secondary | ICD-10-CM | POA: Diagnosis present

## 2024-06-10 DIAGNOSIS — Z79899 Other long term (current) drug therapy: Secondary | ICD-10-CM | POA: Insufficient documentation

## 2024-06-10 LAB — CBC WITH DIFFERENTIAL/PLATELET
Abs Immature Granulocytes: 0.03 K/uL (ref 0.00–0.07)
Basophils Absolute: 0 K/uL (ref 0.0–0.1)
Basophils Relative: 1 %
Eosinophils Absolute: 0.1 K/uL (ref 0.0–0.5)
Eosinophils Relative: 2 %
HCT: 38.3 % (ref 36.0–46.0)
Hemoglobin: 12.9 g/dL (ref 12.0–15.0)
Immature Granulocytes: 1 %
Lymphocytes Relative: 15 %
Lymphs Abs: 0.9 K/uL (ref 0.7–4.0)
MCH: 30.9 pg (ref 26.0–34.0)
MCHC: 33.7 g/dL (ref 30.0–36.0)
MCV: 91.6 fL (ref 80.0–100.0)
Monocytes Absolute: 0.3 K/uL (ref 0.1–1.0)
Monocytes Relative: 5 %
Neutro Abs: 5 K/uL (ref 1.7–7.7)
Neutrophils Relative %: 76 %
Platelets: 172 K/uL (ref 150–400)
RBC: 4.18 MIL/uL (ref 3.87–5.11)
RDW: 13 % (ref 11.5–15.5)
WBC: 6.4 K/uL (ref 4.0–10.5)
nRBC: 0 % (ref 0.0–0.2)

## 2024-06-10 LAB — URINALYSIS, W/ REFLEX TO CULTURE (INFECTION SUSPECTED)
Bacteria, UA: NONE SEEN
Bilirubin Urine: NEGATIVE
Glucose, UA: NEGATIVE mg/dL
Hgb urine dipstick: NEGATIVE
Ketones, ur: NEGATIVE mg/dL
Leukocytes,Ua: NEGATIVE
Nitrite: NEGATIVE
Protein, ur: 30 mg/dL — AB
Specific Gravity, Urine: 1.026 (ref 1.005–1.030)
Squamous Epithelial / HPF: 0 /HPF (ref 0–5)
pH: 7 (ref 5.0–8.0)

## 2024-06-10 LAB — RESP PANEL BY RT-PCR (RSV, FLU A&B, COVID)  RVPGX2
Influenza A by PCR: NEGATIVE
Influenza B by PCR: NEGATIVE
Resp Syncytial Virus by PCR: NEGATIVE
SARS Coronavirus 2 by RT PCR: NEGATIVE

## 2024-06-10 LAB — COMPREHENSIVE METABOLIC PANEL WITH GFR
ALT: 16 U/L (ref 0–44)
AST: 21 U/L (ref 15–41)
Albumin: 4.1 g/dL (ref 3.5–5.0)
Alkaline Phosphatase: 54 U/L (ref 38–126)
Anion gap: 10 (ref 5–15)
BUN: 19 mg/dL (ref 8–23)
CO2: 27 mmol/L (ref 22–32)
Calcium: 9.9 mg/dL (ref 8.9–10.3)
Chloride: 102 mmol/L (ref 98–111)
Creatinine, Ser: 0.66 mg/dL (ref 0.44–1.00)
GFR, Estimated: >60 mL/min
Glucose, Bld: 152 mg/dL — ABNORMAL HIGH (ref 70–99)
Potassium: 3.6 mmol/L (ref 3.5–5.1)
Sodium: 138 mmol/L (ref 135–145)
Total Bilirubin: 0.3 mg/dL (ref 0.0–1.2)
Total Protein: 6.6 g/dL (ref 6.5–8.1)

## 2024-06-10 LAB — TROPONIN T, HIGH SENSITIVITY
Troponin T High Sensitivity: <15 ng/L (ref 0–19)
Troponin T High Sensitivity: <15 ng/L (ref 0–19)

## 2024-06-10 LAB — LIPASE, BLOOD: Lipase: 17 U/L (ref 11–51)

## 2024-06-10 MED ORDER — ACETAMINOPHEN 500 MG PO TABS
1000.0000 mg | ORAL_TABLET | Freq: Once | ORAL | Status: AC
  Administered 2024-06-10: 1000 mg via ORAL
  Filled 2024-06-10: qty 2

## 2024-06-10 MED ORDER — LIDOCAINE VISCOUS HCL 2 % MT SOLN
15.0000 mL | Freq: Once | OROMUCOSAL | Status: AC
  Administered 2024-06-10: 15 mL via ORAL
  Filled 2024-06-10: qty 15

## 2024-06-10 MED ORDER — POLYETHYLENE GLYCOL 3350 17 G PO PACK: 17.0000 g | PACK | Freq: Every day | ORAL | Status: DC | PRN

## 2024-06-10 MED ORDER — ACETAMINOPHEN 325 MG RE SUPP: 650.0000 mg | Freq: Four times a day (QID) | RECTAL | Status: DC | PRN

## 2024-06-10 MED ORDER — SODIUM CHLORIDE 0.9 % IV SOLN: INTRAVENOUS | Status: DC

## 2024-06-10 MED ORDER — PIPERACILLIN-TAZOBACTAM 3.375 G IVPB: 3.3750 g | Freq: Three times a day (TID) | INTRAVENOUS | Status: DC

## 2024-06-10 MED ORDER — ALUM & MAG HYDROXIDE-SIMETH 200-200-20 MG/5ML PO SUSP
30.0000 mL | Freq: Once | ORAL | Status: AC
  Administered 2024-06-10: 30 mL via ORAL
  Filled 2024-06-10: qty 30

## 2024-06-10 MED ORDER — AMOXICILLIN-POT CLAVULANATE 875-125 MG PO TABS: 1.0000 | ORAL_TABLET | Freq: Two times a day (BID) | ORAL | Status: DC

## 2024-06-10 MED ORDER — ENOXAPARIN SODIUM 40 MG/0.4ML IJ SOSY: 40.0000 mg | PREFILLED_SYRINGE | INTRAMUSCULAR | Status: DC

## 2024-06-10 MED ORDER — ONDANSETRON HCL 4 MG/2ML IJ SOLN: 4.0000 mg | Freq: Once | INTRAMUSCULAR | Status: DC

## 2024-06-10 MED ORDER — ONDANSETRON HCL 4 MG PO TABS: 4.0000 mg | ORAL_TABLET | Freq: Four times a day (QID) | ORAL | Status: DC | PRN

## 2024-06-10 MED ORDER — IOHEXOL 300 MG/ML  SOLN
75.0000 mL | Freq: Once | INTRAMUSCULAR | Status: AC | PRN
  Administered 2024-06-10: 75 mL via INTRAVENOUS

## 2024-06-10 MED ORDER — LIDOCAINE 5 % EX PTCH: 1.0000 | MEDICATED_PATCH | CUTANEOUS | Status: DC

## 2024-06-10 MED ORDER — OXYCODONE HCL 5 MG PO TABS: 5.0000 mg | ORAL_TABLET | ORAL | Status: DC | PRN

## 2024-06-10 MED ORDER — FENTANYL CITRATE (PF) 50 MCG/ML IJ SOSY: 12.5000 ug | PREFILLED_SYRINGE | INTRAMUSCULAR | Status: DC | PRN

## 2024-06-10 MED ORDER — MORPHINE SULFATE (PF) 2 MG/ML IV SOLN: 2.0000 mg | Freq: Once | INTRAVENOUS | Status: DC

## 2024-06-10 MED ORDER — FENTANYL CITRATE (PF) 50 MCG/ML IJ SOSY
25.0000 ug | PREFILLED_SYRINGE | Freq: Once | INTRAMUSCULAR | Status: AC
  Administered 2024-06-10: 25 ug via INTRAMUSCULAR
  Filled 2024-06-10: qty 1

## 2024-06-10 MED ORDER — ACETAMINOPHEN 325 MG PO TABS: 650.0000 mg | ORAL_TABLET | Freq: Four times a day (QID) | ORAL | Status: DC | PRN

## 2024-06-10 MED ORDER — ONDANSETRON HCL 4 MG/2ML IJ SOLN: 4.0000 mg | Freq: Four times a day (QID) | INTRAMUSCULAR | Status: DC | PRN

## 2024-06-10 MED ORDER — AMOXICILLIN-POT CLAVULANATE 875-125 MG PO TABS
1.0000 | ORAL_TABLET | Freq: Two times a day (BID) | ORAL | 0 refills | Status: AC
  Filled 2024-06-10: qty 20, 10d supply, fill #0

## 2024-06-10 NOTE — ED Provider Notes (Signed)
 Laura Bass Provider Note    Event Date/Time   First MD Initiated Contact with Patient 06/10/24 [phone]     (approximate)   History   Abdominal Pain   HPI  Laura Bass is a 89 y.o. female CAD, hypertension, memory impairment presenting with abdominal pain and nausea vomiting.  Patient states that her abdomen feels like it is churning.  Initially stated that the abdominal pain was upper but then stated that it was more the middle of her belly that was aching.  Also lower back aching.  Started this morning.  States that when she is sitting still she feels fine but when she is up and moving, it hurts.  No chest pain or shortness of breath, no cough or fever.  Independent history from daughter, patient went to the bathroom today, came out and stated that she was not feeling well.  She took her blood pressure and it was labile, ranging from 150s to 200s.  States that patient did not take her blood pressure medications today.  States no diarrhea, patient is been constipated.  States that patient is a bit tired but is at her mental baseline.  Daughter says no history of dementia, patient does have history of forgetfulness and memory loss.  States that is typical for her to keep asking where her daughter is.  Per independent history from EMS, she is hard of hearing.  Had spit up in the truck but no actual vomiting.  Was bradycardic to the 40s, sinus rhythm.  On independent chart review, she was seen by primary care in September, history of CAD, is on Plavix and nitroglycerin.  Spanish interpreter used for encounter.     Physical Exam   Triage Vital Signs: ED Triage Vitals  Encounter Vitals Group     BP      Girls Systolic BP Percentile      Girls Diastolic BP Percentile      Boys Systolic BP Percentile      Boys Diastolic BP Percentile      Pulse      Resp      Temp      Temp src      SpO2      Weight      Height      Head Circumference       Peak Flow      Pain Score      Pain Loc      Pain Education      Exclude from Growth Chart     Most recent vital signs: Vitals:   06/10/24 0404 06/10/24 0510  BP:  (!) 166/98  Pulse:    Resp:  15  Temp:    SpO2: 96%      General: Awake, no distress.  CV:  Good peripheral perfusion.  Resp:  Normal effort.  Abd:  No distention.  Soft, no significant tenderness on exam, no guarding Other:  Moving all 4 extremities, no CVA tenderness or flank tenderness bilaterally   ED Results / Procedures / Treatments   Labs (all labs ordered are listed, but only abnormal results are displayed) Labs Reviewed  COMPREHENSIVE METABOLIC PANEL WITH GFR - Abnormal; Notable for the following components:      Result Value   Glucose, Bld 152 (*)    All other components within normal limits  URINALYSIS, W/ REFLEX TO CULTURE (INFECTION SUSPECTED) - Abnormal; Notable for the following components:   Color, Urine STRAW (*)  APPearance CLEAR (*)    Protein, ur 30 (*)    All other components within normal limits  RESP PANEL BY RT-PCR (RSV, FLU A&B, COVID)  RVPGX2  CBC WITH DIFFERENTIAL/PLATELET  LIPASE, BLOOD  TROPONIN T, HIGH SENSITIVITY  TROPONIN T, HIGH SENSITIVITY     EKG  EKG shows, sinus bradycardia, rate 48, normal QRS, QTc is prolonged on EKG but suspect this could be artifact and wandering baseline due to movement, PAC noted, no obvious ischemic ST elevation, T wave flattening to lateral leads, QTc prolongation is new compared to prior, will get a repeat EKG.  Repeat EKG shows sinus arrhythmia, rate of 55, normal QRS, normal QTc, no obvious ischemic ST elevation, prior prolonged QTc is no longer present    RADIOLOGY On my independent interpretation, ultrasound shows cholelithiasis with gallbladder wall thickening, suspicious for cholecystitis   PROCEDURES:  Critical Care performed: No  Procedures   MEDICATIONS ORDERED IN ED: Medications  lidocaine  (LIDODERM ) 5 % 1 patch  (has no administration in time range)  piperacillin -tazobactam (ZOSYN ) IVPB 3.375 g (has no administration in time range)  alum & mag hydroxide-simeth (MAALOX/MYLANTA) 200-200-20 MG/5ML suspension 30 mL (30 mLs Oral Given 06/10/24 0357)    And  lidocaine  (XYLOCAINE ) 2 % viscous mouth solution 15 mL (15 mLs Oral Given 06/10/24 0357)  acetaminophen  (TYLENOL ) tablet 1,000 mg (1,000 mg Oral Given 06/10/24 0353)  iohexol  (OMNIPAQUE ) 300 MG/ML solution 75 mL (75 mLs Intravenous Contrast Given 06/10/24 0410)  fentaNYL  (SUBLIMAZE ) injection 25 mcg (25 mcg Intramuscular Given 06/10/24 0733)     IMPRESSION / MDM / ASSESSMENT AND PLAN / ED COURSE  I reviewed the triage vital signs and the nursing notes.                              Differential diagnosis includes, but is not limited to, viral illness, colitis, diverticulitis, peptic ulcer disease, gastritis, acid reflux, atypical ACS, UTI, did also consider if nausea vomiting and vague abdominal symptoms could be related to intracranial pathology, she is moving all 4 extremities without focal weakness or numbness, no slurred speech or facial droop, doubt CVA, lower suspicion for intracranial hemorrhage.  Labs, EKG, troponin, CT. she received some Zofran  by EMS, will give her GI cocktail here.  Offered IV morphine  or IV fentanyl  but daughter declined.  Will put in for Tylenol .  Patient's presentation is most consistent with acute presentation with potential threat to life or bodily function.  Independent interpretation of labs and imaging below.  Clinical course as below.  Her right upper quadrant ultrasound is consistent with cholecystitis.  Daughter states the patient is not a surgical candidate, but is agreeable to admission for IV antibiotics and discussion about possible IR drainage.  Will order antibiotics here.  Will consult hospitalist for admission.  The patient is on the cardiac monitor to evaluate for evidence of arrhythmia and/or significant heart  rate changes.   Clinical Course as of 06/10/24 0740  Tue Jun 10, 2024  0358 Independent review of labs, electrolytes really deranged, LFTs are normal, no leukocytosis, troponin is not elevated [TT]  0455 CT Head Wo Contrast 1. Stable from last year, No acute intracranial abnormality.  [TT]  0455 Resp panel by RT-PCR (RSV, Flu A&B, Covid) Anterior Nasal Swab neg [TT]  0459 On reassessment patient states that she is feeling a lot better, abdomen soft, has mild suprapubic discomfort, patient states that she needs to pee. [TT]  9485 CT ABDOMEN PELVIS W CONTRAST IMPRESSION: 1. Cholelithiasis with distended gallbladder and ill-defined mild thickening of the gallbladder wall raising concern for mural edema. No substantial pericholecystic fluid. Correlation for signs/symptoms of acute cholecystitis recommended. Right upper quadrant ultrasound may prove helpful to further evaluate. 2. Circumferential bronchial wall thickening in the dependent lower lobes bilaterally with areas of architectural distortion and volume loss. Superimposed consolidative airspace opacity is seen in the costophrenic sulci bilaterally. Component of aspiration and/or pneumonia not excluded. 3.  Aortic Atherosclerosis (ICD10-I70.0).   [TT]  [id number] Urinalysis, w/ Reflex to Culture (Infection Suspected) -Urine, Clean Catch(!) Not consistent with UTI [TT]  0627 On reassessment patient states that she has mild tenderness to the right upper quadrant, is mildly tender on exam.  Daughter states that she has not had any fever, not have any complaints of chest pain or shortness of breath or cough.  CT imaging shows possible bronchial thickening as well as opacities in the bilateral costophrenic foci that could be aspiration or pneumonia, doubt this is pneumonia given her lack of symptoms.  No hypoxia or tachypnea. [TT]  0628 Did discuss with daughter about imaging and lab results so far, daughter states that patient is not a surgical  candidate since she is too old and frail. [TT]  [phone] Patient removed IV, daughter is asking to hold off reinserting the IV until ultrasound results are back.  If it does show possible cholecystitis, she is agreeable for reinsertion of the IV and admission for IV antibiotics and possible biliary drain. [TT]  [phone] Did call lab to follow-up on troponin and lipase, they said that they are rerunning the sample. [TT]  9347 US  ABDOMEN LIMITED RUQ (LIVER/GB) IMPRESSION: Cholelithiasis with gallbladder wall thickening and positive sonographic Murphy sign. Imaging features are compatible with acute cholecystitis.   [TT]  0657 Lipase and troponin are not elevated. [TT]  [phone] Daughter is now okay with the fentanyl .  Will order a dose. [TT]    Clinical Course User Index [TT] Waymond Lorelle Cummins, MD     FINAL CLINICAL IMPRESSION(S) / ED DIAGNOSES   Final diagnoses:  Abdominal pain of multiple sites  Nausea and vomiting, unspecified vomiting type  Low back pain without sciatica, unspecified back pain laterality, unspecified chronicity  Cholecystitis     Rx / DC Orders   ED Discharge Orders     None        Note:  This document was prepared using Dragon voice recognition software and may include unintentional dictation errors.    Waymond Lorelle Cummins, MD 06/10/24 [phone]

## 2024-06-10 NOTE — ED Notes (Signed)
Pt still refusing IV at this time.

## 2024-06-10 NOTE — ED Notes (Signed)
 Pt wishes to leave AMA. AMA form signed. Dr Roann to send antibiotics to pt pharmacy.

## 2024-06-10 NOTE — ED Notes (Signed)
 Interpreter #[id number] Sharrell used for triage assessment.

## 2024-06-10 NOTE — ED Triage Notes (Addendum)
 Pt arrives from home via [facility].  C/O Centralized abdominal pain, back pain, nausea upon waking today..No BM in 3 days. HX HTN, MI w/stent. A&Ox2 per baseline.

## 2024-06-10 NOTE — ED Notes (Addendum)
 Pt refusing IV at this time. Pt agitated and wanting to leave. Pt given pain medication in an effort to calm her agitation and support compliance with antibiotic therapy. Daughter at bedside.

## 2024-06-10 NOTE — ED Notes (Signed)
 Patient removed IV cath at this time. This RN was going to replace IV, however daughter requesting to wait to replace. States if she needs one later then she can get it then. Dr Jerri made aware and ok'd to hold off on replacing IV

## 2024-06-10 NOTE — H&P (Signed)
 "  History and Physical    Laura Bass FMW:[id number] DOB: 1924-08-03 DOA: 06/10/2024  DOS: the patient was seen and examined on 06/10/2024  PCP: Laura Bradley, MD   Patient coming from: Home  I have personally briefly reviewed patient's old medical records in [facility]  Chief Complaint: Abdominal pain  HPI: Laura Bass is a pleasant 89 y.o. female with medical history significant for CAD s/p MI, HTN who was admitted from the emergency room for abdominal pain and diagnosed with acute cholecystitis.  When I went to examine the patient she stated that she wants to sign out AMA.  She does not speak English but her daughter was at bedside and patient after explaining the complication up to the death from acute cholecystitis not taking medications in the hospital, she she stated that she does not have anything and she does not want to stay in the hospital.  She feels fine.  She understands that if she gets worse she will have very severe complication up to death.  ED Course: Upon arrival to the ED, patient is found to be patient was started on IV antibiotic.  Review of Systems:  ROS  All other systems negative except as noted in the HPI.  Past Medical History:  Diagnosis Date   CAD (coronary artery disease)    History of MI (myocardial infarction)    Hypertension     Past Surgical History:  Procedure Laterality Date   APPENDECTOMY     CORONARY ANGIOPLASTY WITH STENT PLACEMENT       reports that she has never smoked. She has never used smokeless tobacco. She reports that she does not currently use alcohol. She reports that she does not use drugs.  Allergies[1]  History reviewed. No pertinent family history.  Prior to Admission medications  Medication Sig Start Date End Date Taking? Authorizing Provider  aspirin EC 81 MG tablet Take by mouth.    [provider]  azithromycin  (ZITHROMAX ) 250 MG tablet Take 1 tablet (250 mg total) by mouth  daily. 03/31/22   Sung, Jade J, MD  clopidogrel (PLAVIX) 75 MG tablet TAKE 1 TABLET BY MOUTH DAILY 07/16/17   [provider]  ENSURE (ENSURE) Take by mouth. 05/01/16   [provider]  isosorbide  mononitrate (IMDUR ) 30 MG 24 hr tablet Take 1 tablet (30 mg total) by mouth daily for 15 days. 11/21/20 12/06/20  Viviann Pastor, MD  omeprazole (PRILOSEC) 20 MG capsule  07/16/17   [provider]  quinapril (ACCUPRIL) 10 MG tablet  09/22/17   [provider]  simvastatin (ZOCOR) 20 MG tablet  08/22/17   [provider]  spironolactone (ALDACTONE) 25 MG tablet TK 1/2 T PO ONCE D 07/24/17   [provider]    Physical Exam: Vitals:   06/10/24 0334 06/10/24 0400 06/10/24 0404 06/10/24 0510  BP:  (!) 169/73  (!) 166/98  Pulse:  (!) 50    Resp:  17  15  Temp:      TempSrc:      SpO2:  96% 96%   Weight: 56.4 kg     Height: 5' (1.524 m)       Physical Exam   Constitutional: Alert, awake, calm, comfortable HEENT: Neck supple Respiratory: Clear to auscultation B/L, no wheezing, no rales.  Cardiovascular: Regular rate and rhythm, no murmurs / rubs / gallops. No extremity edema. 2+ pedal pulses. No carotid bruits.  Abdomen: Soft, no tenderness, Bowel sounds positive.  Musculoskeletal: no clubbing / cyanosis. Good ROM, no contractures. Normal muscle tone.  Skin: no rashes, lesions, ulcers. Neurologic: CN 2-12 grossly intact. Sensation intact, No focal deficit identified Psychiatric: Alert and oriented x 3. Normal mood.    Labs on Admission: I have personally reviewed following labs and imaging studies  CBC: Recent Labs  Lab 06/10/24 0320  WBC 6.4  NEUTROABS 5.0  HGB 12.9  HCT 38.3  MCV 91.6  PLT 172   Basic Metabolic Panel: Recent Labs  Lab 06/10/24 0320  NA 138  K 3.6  CL 102  CO2 27  GLUCOSE 152*  BUN 19  CREATININE 0.66  CALCIUM 9.9   GFR: Estimated Creatinine Clearance: 31.7 mL/min (by C-G formula based on SCr of 0.66  mg/dL). Liver Function Tests: Recent Labs  Lab 06/10/24 0320  AST 21  ALT 16  ALKPHOS 54  BILITOT 0.3  PROT 6.6  ALBUMIN 4.1   Recent Labs  Lab 06/10/24 0528  LIPASE 17   No results for input(s): AMMONIA in the last 168 hours. Coagulation Profile: No results for input(s): INR, PROTIME in the last 168 hours. Cardiac Enzymes: No results for input(s): CKTOTAL, CKMB, CKMBINDEX, TROPONINI, TROPONINIHS in the last 168 hours. BNP (last 3 results) Recent Labs    01/25/24 0134  BNP 62.3   HbA1C: No results for input(s): HGBA1C in the last 72 hours. CBG: No results for input(s): GLUCAP in the last 168 hours. Lipid Profile: No results for input(s): CHOL, HDL, LDLCALC, TRIG, CHOLHDL, LDLDIRECT in the last 72 hours. Thyroid Function Tests: No results for input(s): TSH, T4TOTAL, FREET4, T3FREE, THYROIDAB in the last 72 hours. Anemia Panel: No results for input(s): VITAMINB12, FOLATE, FERRITIN, TIBC, IRON, RETICCTPCT in the last 72 hours. Urine analysis:    Component Value Date/Time   COLORURINE STRAW (A) 06/10/2024 0502   APPEARANCEUR CLEAR (A) 06/10/2024 0502   LABSPEC 1.026 06/10/2024 0502   PHURINE 7.0 06/10/2024 0502   GLUCOSEU NEGATIVE 06/10/2024 0502   HGBUR NEGATIVE 06/10/2024 0502   BILIRUBINUR NEGATIVE 06/10/2024 0502   KETONESUR NEGATIVE 06/10/2024 0502   PROTEINUR 30 (A) 06/10/2024 0502   NITRITE NEGATIVE 06/10/2024 0502   LEUKOCYTESUR NEGATIVE 06/10/2024 0502    Radiological Exams on Admission: I have personally reviewed images US  ABDOMEN LIMITED RUQ (LIVER/GB) Result Date: 06/10/2024 CLINICAL DATA:  Right upper quadrant pain. EXAM: ULTRASOUND ABDOMEN LIMITED RIGHT UPPER QUADRANT COMPARISON:  CT scan earlier same day FINDINGS: Gallbladder: Multiple dependent gallstones evident with associated sludge in the lumen of the gallbladder. Stones measure up to about 5 mm in size. Gallbladder wall is thickened at 5 mm.  Sonographer reports a positive sonographic Murphy sign. Common bile duct: Diameter: 5 mm Liver: No focal lesion identified. Within normal limits in parenchymal echogenicity. Portal vein is patent on color Doppler imaging with normal direction of blood flow towards the liver. Other: None. IMPRESSION: Cholelithiasis with gallbladder wall thickening and positive sonographic Murphy sign. Imaging features are compatible with acute cholecystitis. Electronically Signed   By: Camellia Candle M.D.   On: 06/10/2024 06:47   CT ABDOMEN PELVIS W CONTRAST Result Date: 06/10/2024 CLINICAL DATA:  Nonlocalized abdominal pain.  Nausea. EXAM: CT ABDOMEN AND PELVIS WITH CONTRAST TECHNIQUE: Multidetector CT imaging of the abdomen and pelvis was performed using the standard protocol following bolus administration of intravenous contrast. RADIATION DOSE REDUCTION: This exam was performed according to the departmental dose-optimization program which includes automated exposure control, adjustment of the mA and/or kV according to patient size and/or use  of iterative reconstruction technique. CONTRAST:  75mL OMNIPAQUE  IOHEXOL  300 MG/ML  SOLN COMPARISON:  None Available. FINDINGS: Lower chest: Circumferential bronchial wall thickening noted in the dependent lower lobes bilaterally with areas of architectural distortion and volume loss. Superimposed consolidative airspace opacity is seen in the the costophrenic sulci bilaterally. No pleural effusion. Hepatobiliary: Small area of low attenuation in the anterior liver, adjacent to the falciform ligament, is in a characteristic location for focal fatty deposition/anomalous perfusion. Tiny gallstones evident. Gallbladder is distended with ill definition of the gallbladder wall raising concern for mural edema. No substantial pericholecystic fluid. No intrahepatic or extrahepatic biliary dilation. Pancreas: No focal mass lesion. No dilatation of the main duct. No intraparenchymal cyst. No  peripancreatic edema. Spleen: No splenomegaly. No suspicious focal mass lesion. Adrenals/Urinary Tract: No adrenal nodule or mass. Bilateral renal cysts noted measuring up to about 2.9 cm maximum size in the right kidney. Tiny well-defined homogeneous low-density lesions in both kidneys are too small to characterize but are statistically most likely benign and probably cysts. No followup imaging is recommended. No evidence for hydroureter. The urinary bladder appears normal for the degree of distention. Stomach/Bowel: Stomach is unremarkable. No gastric wall thickening. No evidence of outlet obstruction. Duodenum is normally positioned as is the ligament of Treitz. No small bowel wall thickening. No small bowel dilatation. The terminal ileum is normal. The appendix is not well visualized, but there is no edema or inflammation in the region of the cecal tip to suggest appendicitis. No gross colonic mass. No colonic wall thickening. Vascular/Lymphatic: There is moderate atherosclerotic calcification of the abdominal aorta without aneurysm. There is no gastrohepatic or hepatoduodenal ligament lymphadenopathy. No retroperitoneal or mesenteric lymphadenopathy. No pelvic sidewall lymphadenopathy. Reproductive: The uterus is unremarkable.  There is no adnexal mass. Other: No substantial intraperitoneal free fluid. Musculoskeletal: No worrisome lytic or sclerotic osseous abnormality. IMPRESSION: 1. Cholelithiasis with distended gallbladder and ill-defined mild thickening of the gallbladder wall raising concern for mural edema. No substantial pericholecystic fluid. Correlation for signs/symptoms of acute cholecystitis recommended. Right upper quadrant ultrasound may prove helpful to further evaluate. 2. Circumferential bronchial wall thickening in the dependent lower lobes bilaterally with areas of architectural distortion and volume loss. Superimposed consolidative airspace opacity is seen in the costophrenic sulci  bilaterally. Component of aspiration and/or pneumonia not excluded. 3.  Aortic Atherosclerosis (ICD10-I70.0). Electronically Signed   By: Camellia Candle M.D.   On: 06/10/2024 05:04   CT Head Wo Contrast Result Date: 06/10/2024 EXAM: CT HEAD WITHOUT CONTRAST 06/10/2024 04:28:03 AM TECHNIQUE: CT of the head was performed without the administration of intravenous contrast. Automated exposure control, iterative reconstruction, and/or weight based adjustment of the mA/kV was utilized to reduce the radiation dose to as low as reasonably achievable. COMPARISON: CT head dated 10/30/2023. CLINICAL HISTORY: 89 year old female with mental status change of unknown cause, abdominal and back pain, and nausea. FINDINGS: BRAIN AND VENTRICLES: No acute hemorrhage. No evidence of acute infarct. No hydrocephalus. No extra-axial collection. No mass effect or midline shift. Stable brain volume. Patchy and confluent bilateral cerebral white matter hypodensity. Faint chronic basal ganglia vascular calcifications. Stable gray white differentiation. Intracranial arteries. No suspicious intracranial vascular hyperdensity. Calcified atherosclerosis at the skull base. ORBITS: Paranasal sinuses, tympanic cavities and mastoids are well aerated. No gaze deviation. SOFT TISSUES AND SKULL: No acute soft tissue abnormality. No skull fracture. IMPRESSION: 1. Stable from last year, No acute intracranial abnormality. Electronically signed by: Helayne Hurst MD 06/10/2024 04:39 AM EST RP Workstation: [id number]  Assessment/Plan Principal Problem:   Acute cholecystitis    Assessment and Plan: Patient signed out AMA.  Per patient and her daughter's request I wrote a prescription for Augmentin  for outpatient.      Nena Rebel, MD [facility] 06/10/2024, 9:37 AM        [1]  Allergies Allergen Reactions   Atorvastatin Nausea And Vomiting    Other reaction(s): Muscle Pain   Simvastatin Other (See Comments)    Oxybutynin Chloride Palpitations    tachycardia   Spironolactone     Other reaction(s): Muscle Pain   "

## 2024-06-10 NOTE — ED Notes (Signed)
Dr. Paudel at bedside.

## 2024-06-10 NOTE — Discharge Summary (Signed)
 Patient signed out AMA.  Please refer to my history and physical for detail.

## 2024-06-10 NOTE — ED Notes (Signed)
 Pt still refusing IV and requesting to go home. I explained we are waiting for hospitalist to come talk to her.

## 2024-06-10 NOTE — ED Notes (Signed)
 MD at bedside.
